# Patient Record
Sex: Male | Born: 1950
Health system: Southern US, Community
[De-identification: ages and names within clinical notes are randomized; demographics above are authoritative.]

## PROBLEM LIST (undated history)

## (undated) DIAGNOSIS — K648 Other hemorrhoids: Secondary | ICD-10-CM

## (undated) DIAGNOSIS — H269 Unspecified cataract: Secondary | ICD-10-CM

## (undated) DIAGNOSIS — K8020 Calculus of gallbladder without cholecystitis without obstruction: Secondary | ICD-10-CM

## (undated) DIAGNOSIS — J189 Pneumonia, unspecified organism: Secondary | ICD-10-CM

## (undated) DIAGNOSIS — I10 Essential (primary) hypertension: Secondary | ICD-10-CM

## (undated) DIAGNOSIS — I499 Cardiac arrhythmia, unspecified: Secondary | ICD-10-CM

## (undated) DIAGNOSIS — M1990 Unspecified osteoarthritis, unspecified site: Secondary | ICD-10-CM

## (undated) DIAGNOSIS — T7840XA Allergy, unspecified, initial encounter: Secondary | ICD-10-CM

## (undated) DIAGNOSIS — H3320 Serous retinal detachment, unspecified eye: Secondary | ICD-10-CM

## (undated) DIAGNOSIS — K5790 Diverticulosis of intestine, part unspecified, without perforation or abscess without bleeding: Secondary | ICD-10-CM

## (undated) HISTORY — DX: Calculus of gallbladder without cholecystitis without obstruction: K80.20

## (undated) HISTORY — PX: KNEE ARTHROSCOPY: SUR90

## (undated) HISTORY — DX: Unspecified osteoarthritis, unspecified site: M19.90

## (undated) HISTORY — DX: Allergy, unspecified, initial encounter: T78.40XA

## (undated) HISTORY — DX: Other hemorrhoids: K64.8

## (undated) HISTORY — PX: YAG LASER APPLICATION: SHX6189

## (undated) HISTORY — DX: Pneumonia, unspecified organism: J18.9

## (undated) HISTORY — PX: CATARACT EXTRACTION: SUR2

## (undated) HISTORY — PX: TONSILLECTOMY: SUR1361

## (undated) HISTORY — PX: KNEE CARTILAGE SURGERY: SHX688

## (undated) HISTORY — DX: Unspecified cataract: H26.9

## (undated) HISTORY — DX: Essential (primary) hypertension: I10

## (undated) HISTORY — DX: Serous retinal detachment, unspecified eye: H33.20

## (undated) HISTORY — DX: Cardiac arrhythmia, unspecified: I49.9

## (undated) HISTORY — DX: Diverticulosis of intestine, part unspecified, without perforation or abscess without bleeding: K57.90

## (undated) HISTORY — PX: RETINAL DETACHMENT SURGERY: SHX105

---

## 2005-06-10 ENCOUNTER — Inpatient Hospital Stay (HOSPITAL_COMMUNITY): Admission: RE | Admit: 2005-06-10 | Discharge: 2005-06-13 | Payer: Self-pay | Admitting: Ophthalmology

## 2009-11-02 HISTORY — PX: CHOLECYSTECTOMY: SHX55

## 2010-01-30 ENCOUNTER — Encounter (INDEPENDENT_AMBULATORY_CARE_PROVIDER_SITE_OTHER): Payer: Self-pay | Admitting: Psychology

## 2010-01-30 ENCOUNTER — Inpatient Hospital Stay (HOSPITAL_COMMUNITY): Admission: EM | Admit: 2010-01-30 | Discharge: 2010-02-03 | Payer: Self-pay | Admitting: Emergency Medicine

## 2010-01-31 ENCOUNTER — Encounter (INDEPENDENT_AMBULATORY_CARE_PROVIDER_SITE_OTHER): Payer: Self-pay | Admitting: Internal Medicine

## 2010-12-27 IMAGING — CT CT ABD-PELV W/O CM
2 of 4 series · 17 of 46 positions shown, 19 images · non-contrast
Comparison: Ultrasound 01/30/2010.

CLINICAL DATA: Abdominal pain and fever.

CT ABDOMEN AND PELVIS WITHOUT CONTRAST
TECHNIQUE: Multidetector CT imaging of the abdomen and pelvis was
performed following the standard protocol without intravenous
contrast.

[Series 4: abd/pelv w/o 5.0 b31f st · axial · non-contrast · 0.78mm/px · z∈[-503,-18]mm · 14 of 107 slices shown, 16 images]
[im 5/107  soft-tissue]
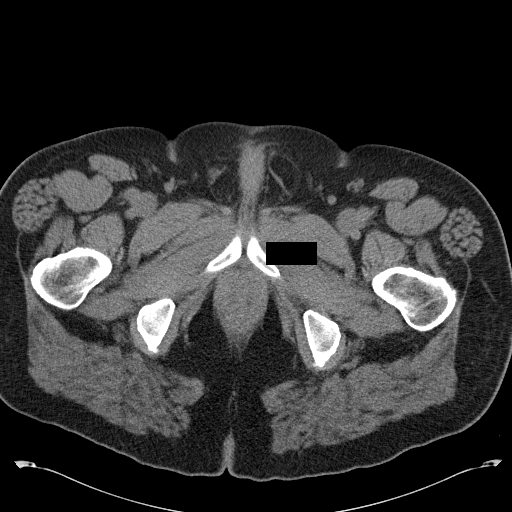
[im 5/107  bone]
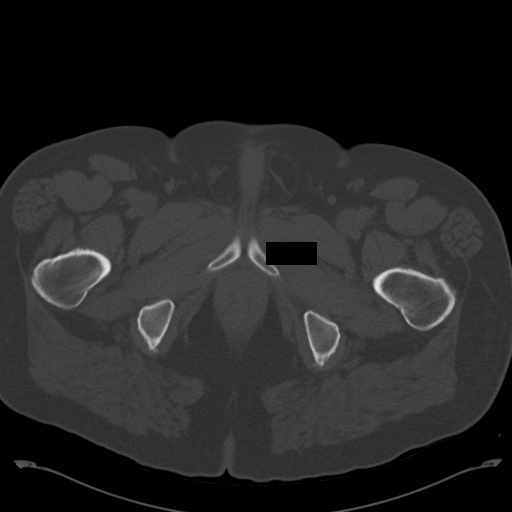
[im 13/107  soft-tissue]
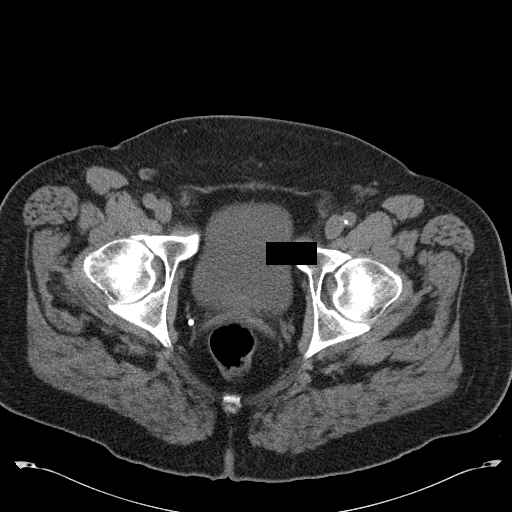
[im 22/107  soft-tissue]
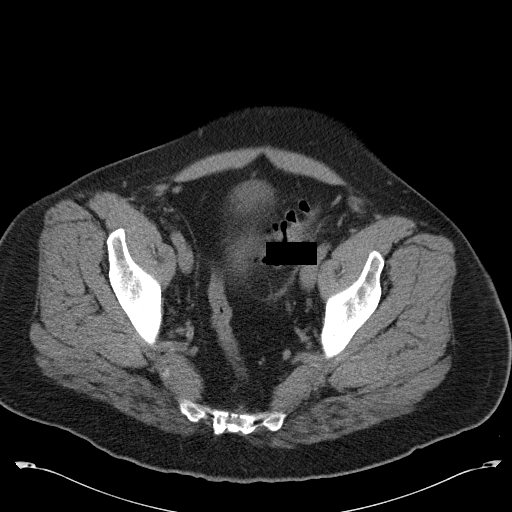
[im 30/107  soft-tissue]
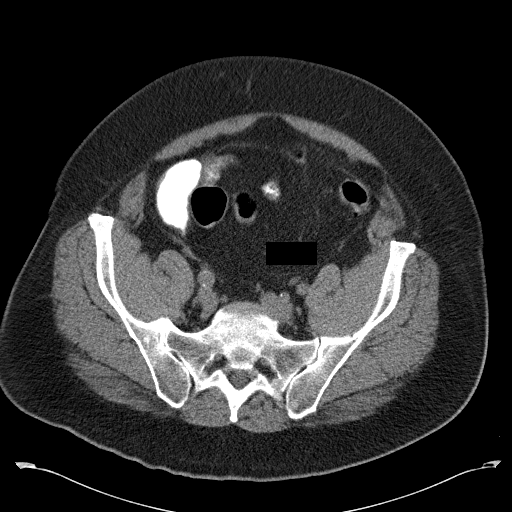
[im 34/107  soft-tissue]
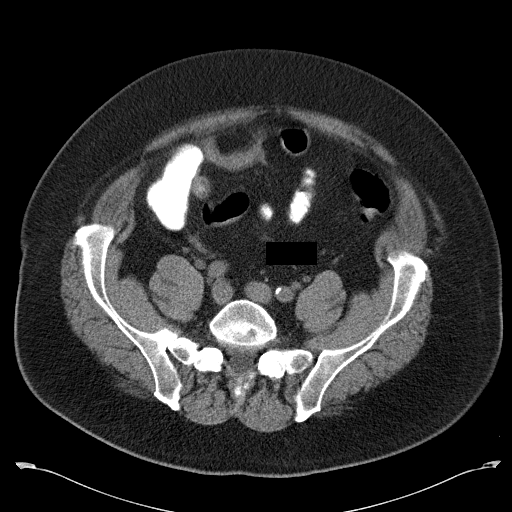
[im 43/107  soft-tissue]
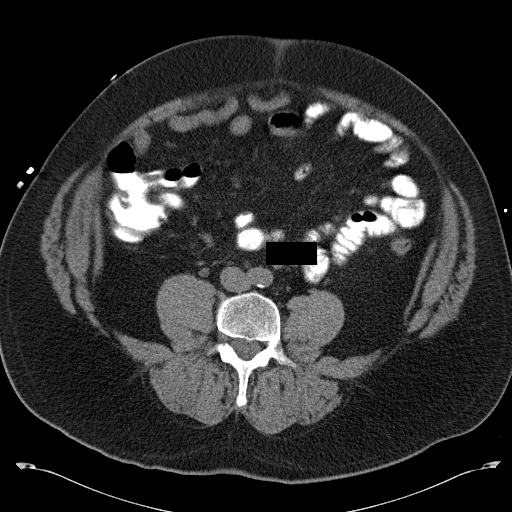
[im 51/107  soft-tissue]
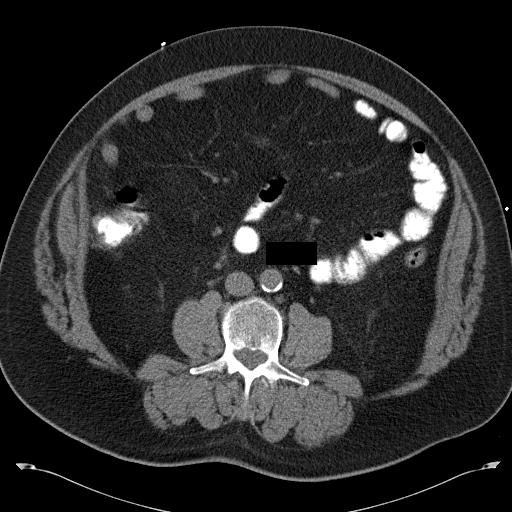
[im 56/107  soft-tissue]
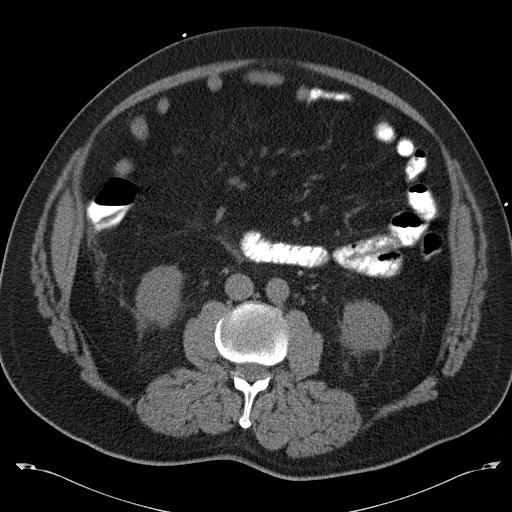
[im 64/107  soft-tissue]
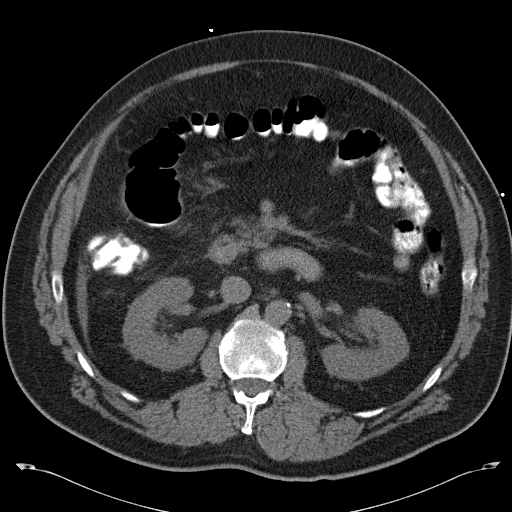
[im 64/107  bone]
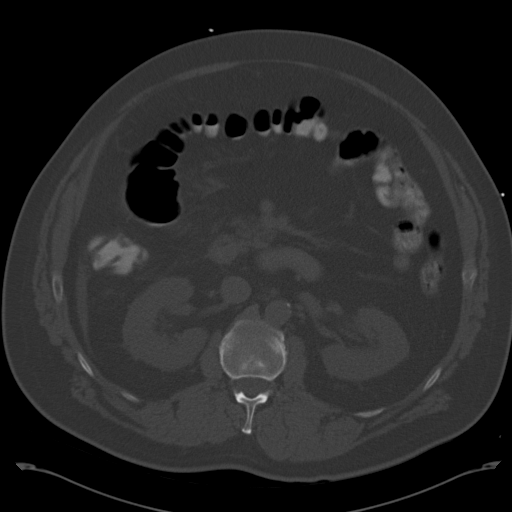
[im 73/107  soft-tissue]
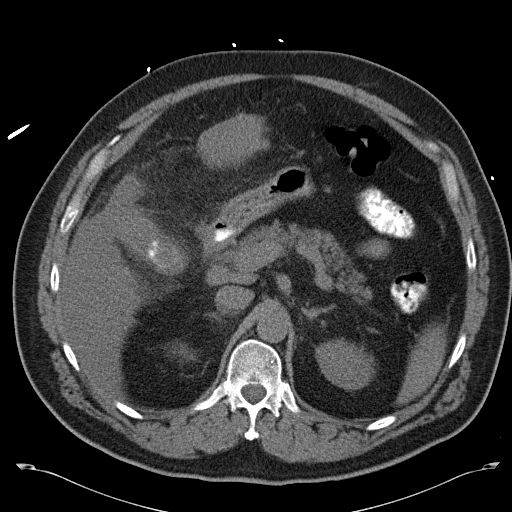
[im 81/107  soft-tissue]
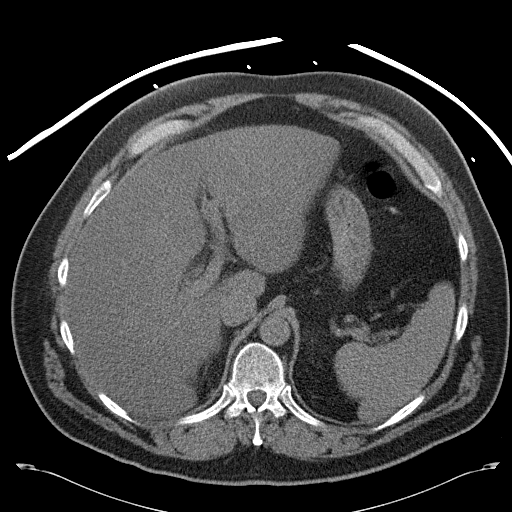
[im 85/107  soft-tissue]
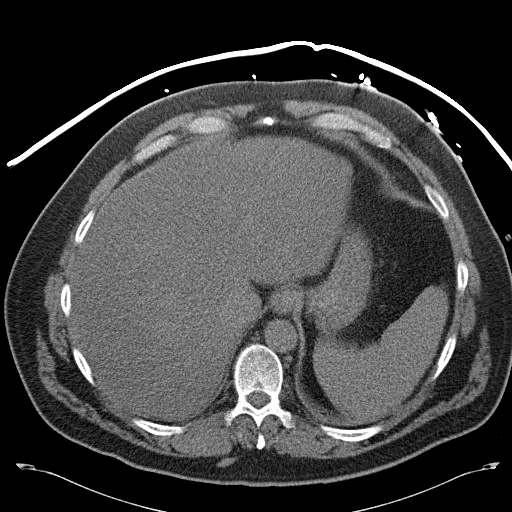
[im 94/107  soft-tissue]
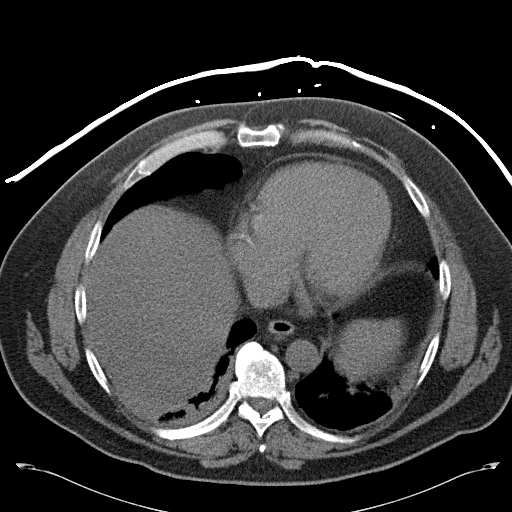
[im 102/107  soft-tissue]
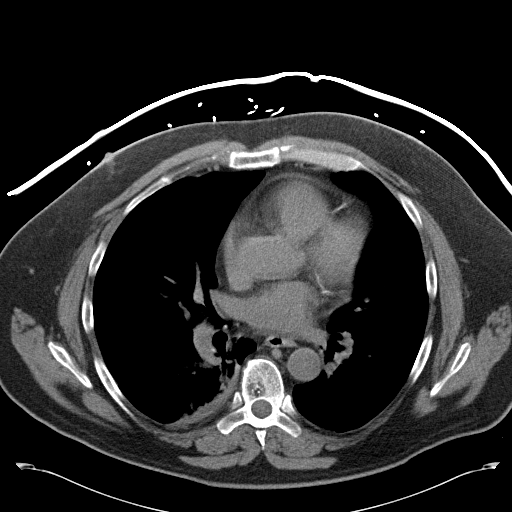

[Series 602: cor a/p · coronal · 1.04mm/px · 3 of 147 slices shown]
[im 49/147  soft-tissue]
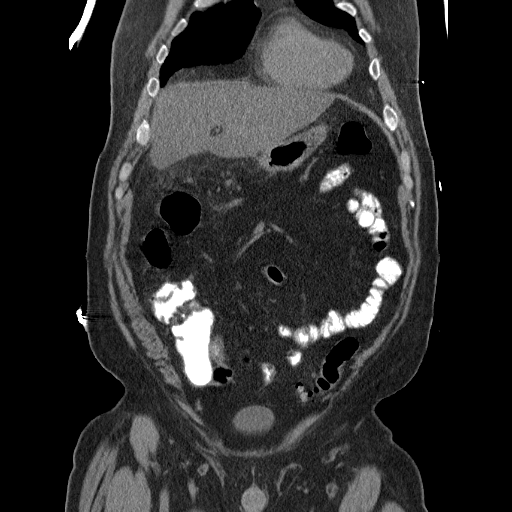
[im 65/147  soft-tissue]
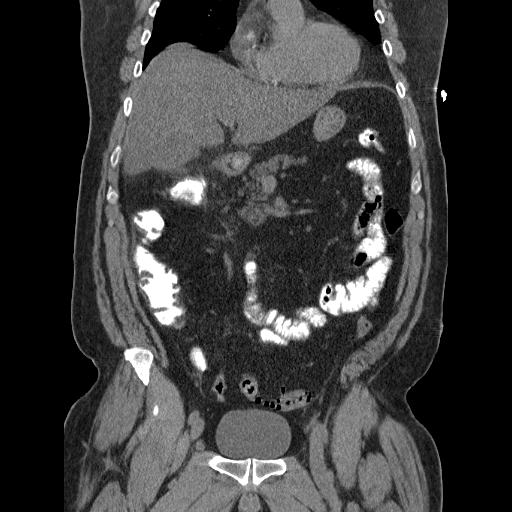
[im 82/147  soft-tissue]
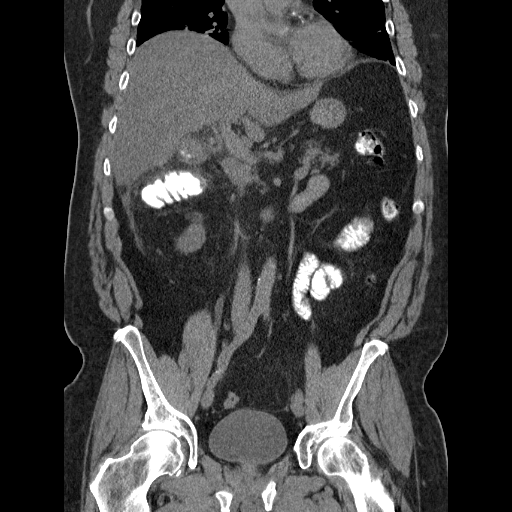

[17 of 46 positions shown; findings below may reference images not displayed]

FINDINGS: Bibasilar atelectasis.  Negative for pleural effusion.

There is a large partially calcified gallstone measuring 3.5 cm.
Gallbladder wall is mildly thickened and there is stranding in the
fat around the gallbladder suggesting acute cholecystitis.  There
is no free fluid.  There is fatty infiltration of  the liver.
There is no ductal dilatation.  Pancreas spleen kidneys are normal.

There is no bowel obstruction.  No mass or adenopathy is present.
There is sigmoid diverticulosis.
IMPRESSION: Gallstones.  There is gallbladder wall thickening and
pericholecystic edema suggesting acute cholecystitis.

Sigmoid diverticulosis.

## 2010-12-27 IMAGING — US US ABDOMEN COMPLETE
1 series · 14 of 25 positions shown · non-contrast
Comparison: None.

CLINICAL DATA: Fever.

COMPLETE ABDOMINAL ULTRASOUND

[Series 1: us abdomen complete · 0.36mm/px · 14 of 77 slices shown]
[im 1/77]
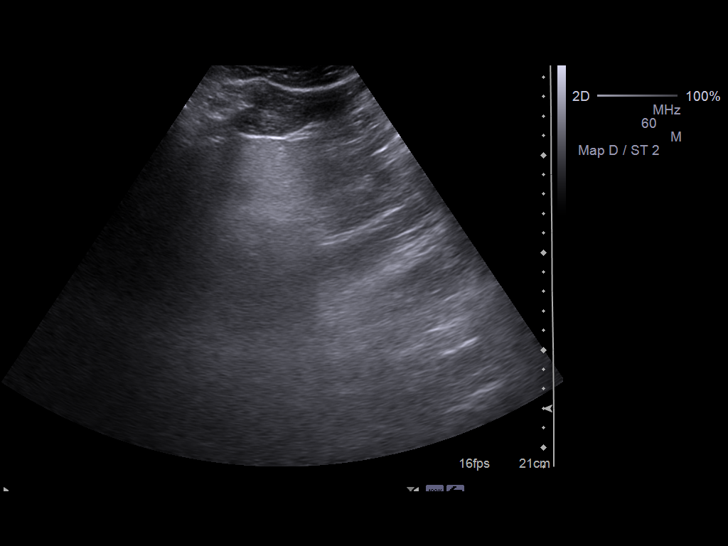
[im 7/77]
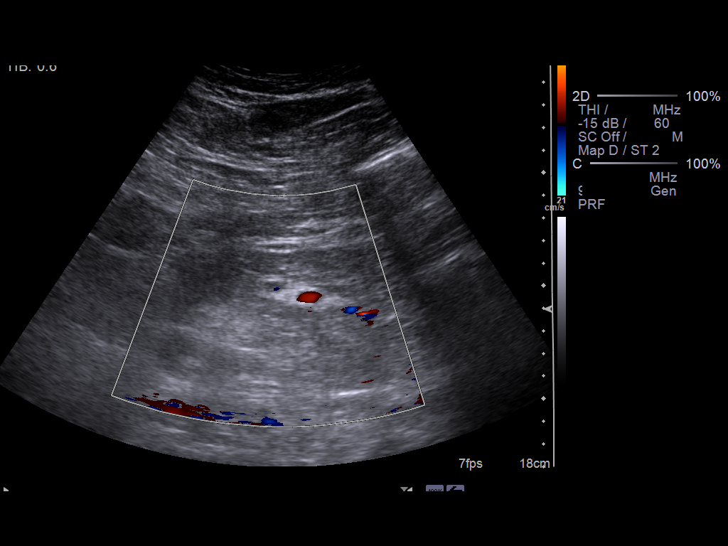
[im 13/77]
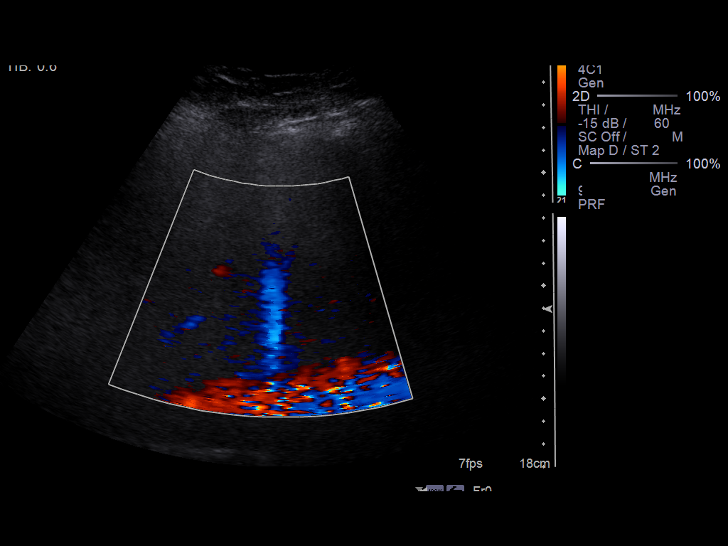
[im 20/77]
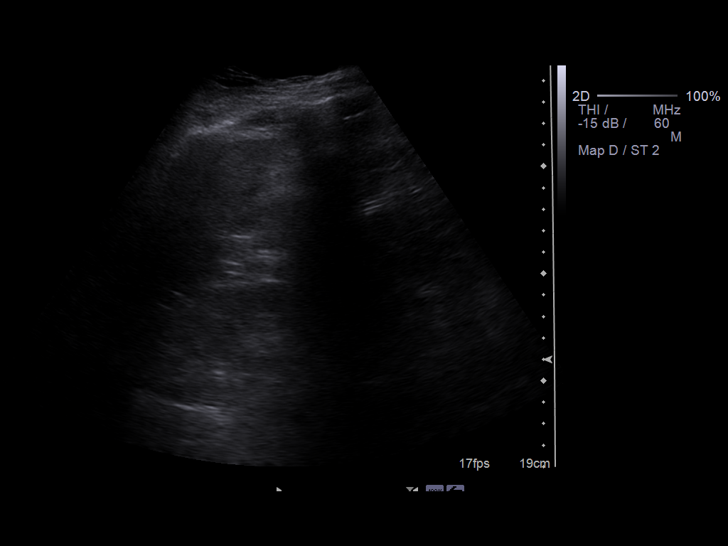
[im 26/77]
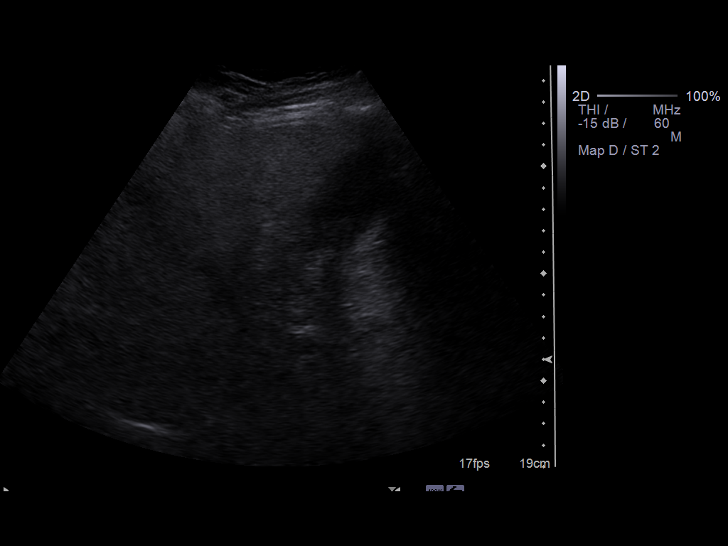
[im 29/77]
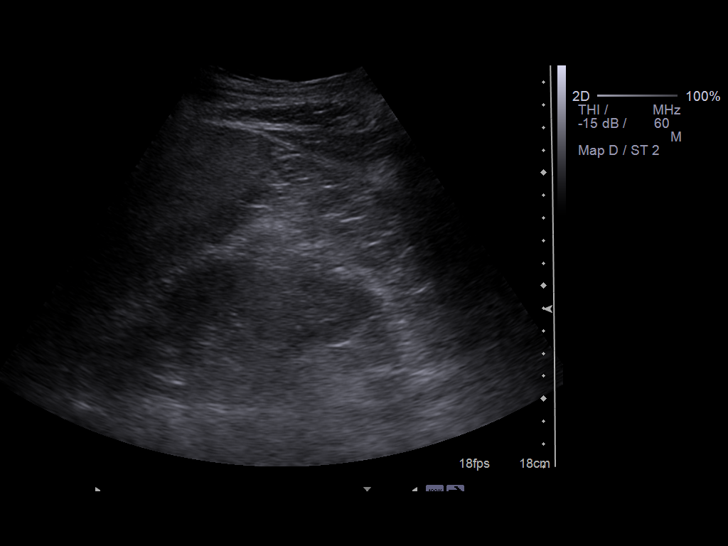
[im 35/77]
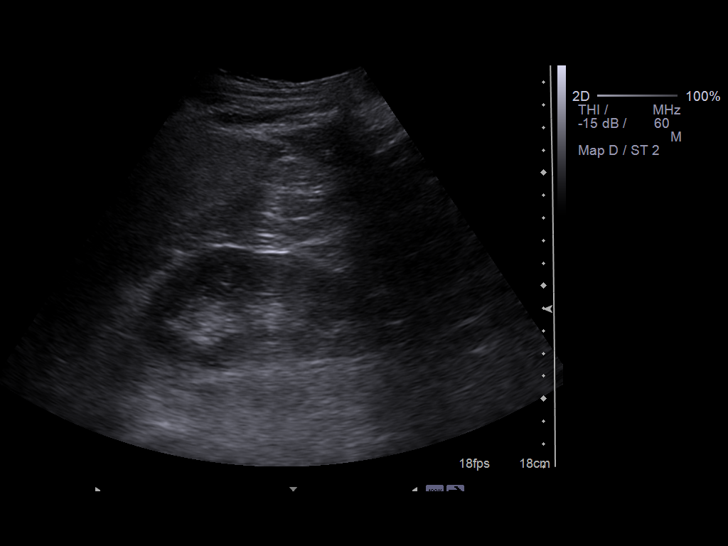
[im 42/77]
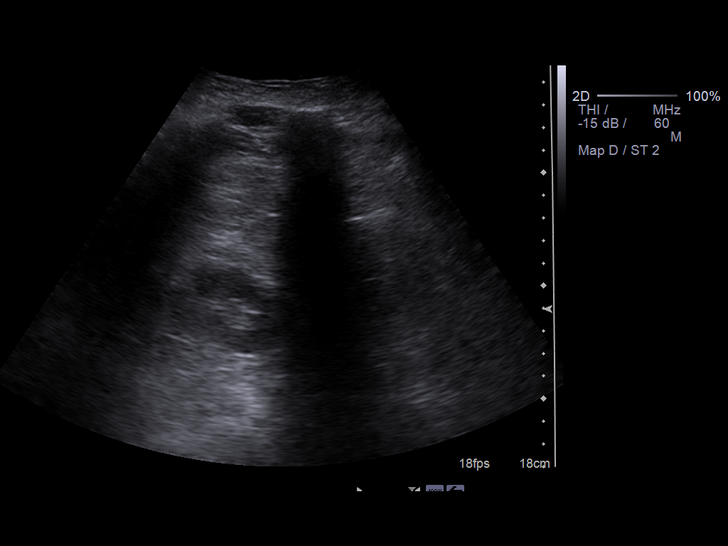
[im 48/77]
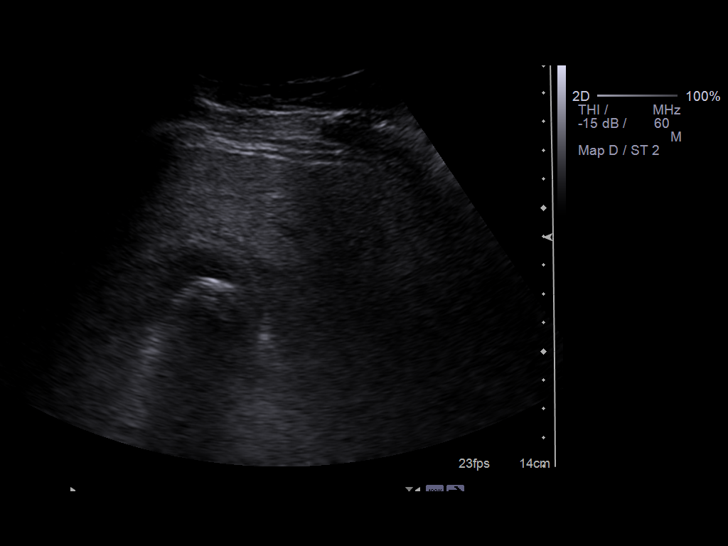
[im 51/77]
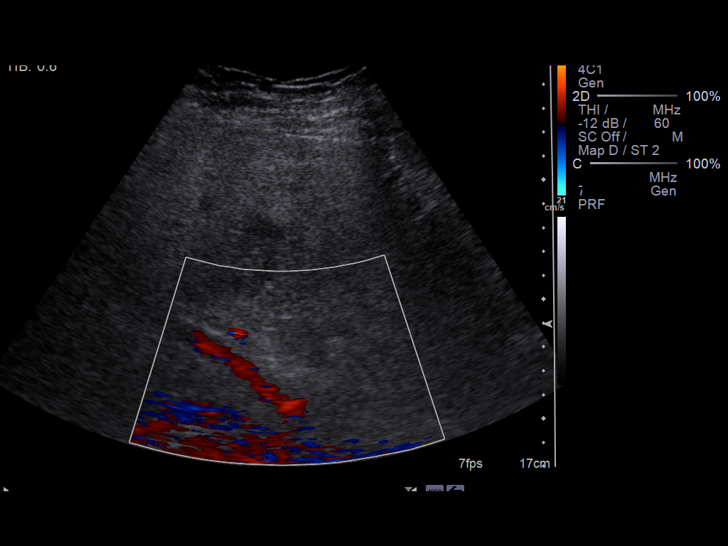
[im 58/77]
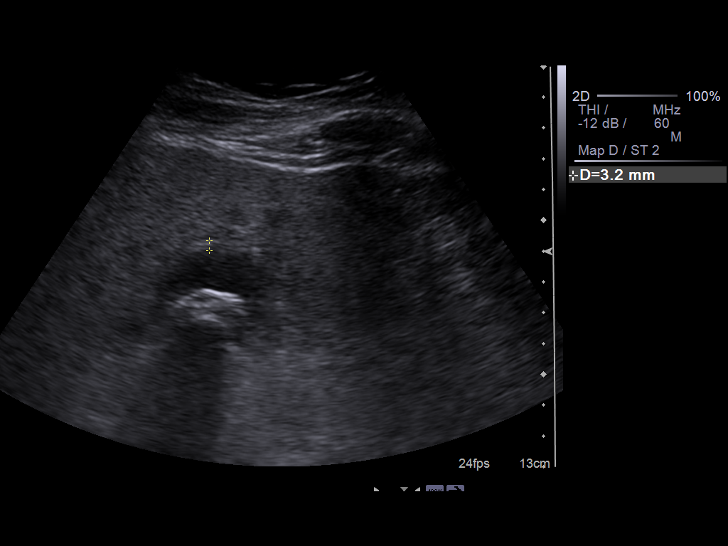
[im 64/77]
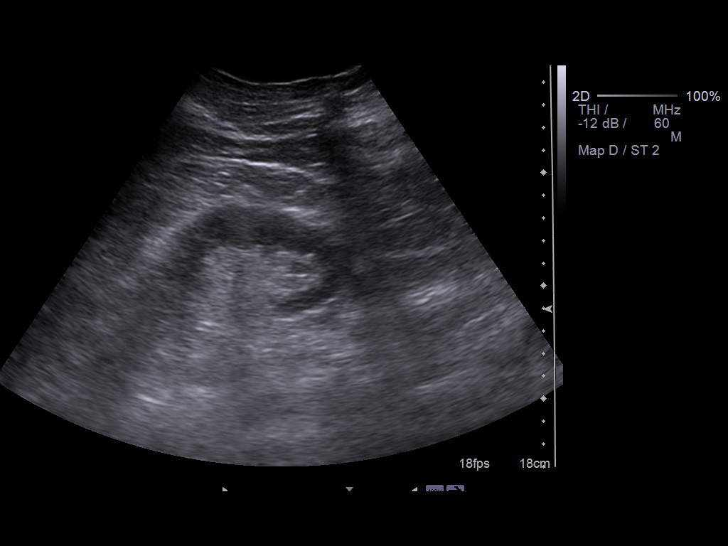
[im 70/77]
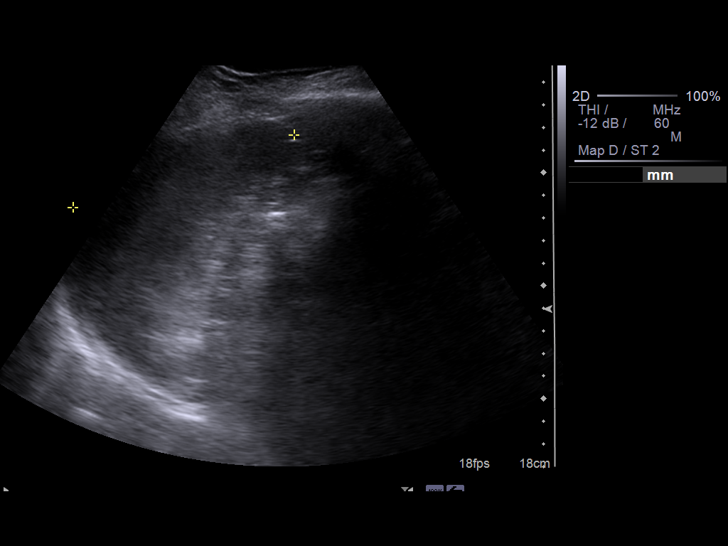
[im 77/77]
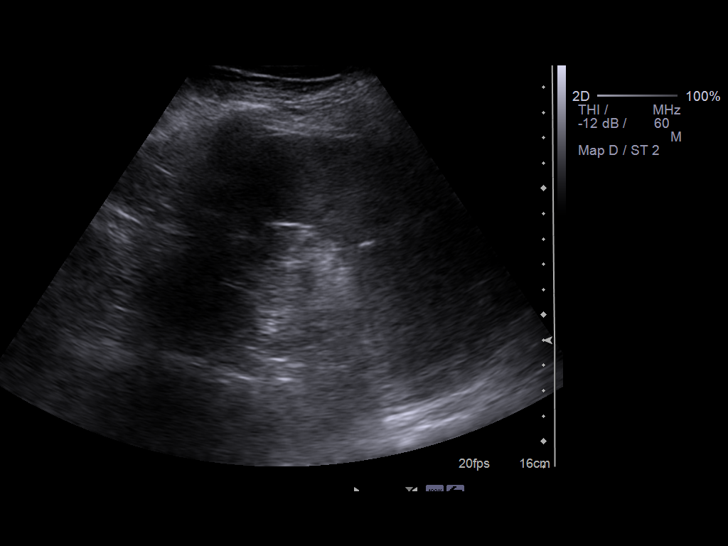

[14 of 25 positions shown; findings below may reference images not displayed]

FINDINGS: Gallbladder:    Cholelithiasis.  2 stones measure up to 2.5 cm.  No
wall thickening or pericholecystic fluid. Sonographic Murphy's sign
was not elicited.

Common bile duct: Normal, 5 mm.

Liver: Severely degraded evaluation due to technique related
factors and extent of hyperechogenicity.  The extent of hyper
echogenicity is favored to represent severe fatty infiltration.

IVC: Negative

Pancreas:  Poorly visualized due to overlying bowel gas.

Spleen:  Normal in size and echogenicity.

Right Kidney:  10.7 cm.  No hydronephrosis.

Left Kidney:  10.5 cm. No hydronephrosis.

Abdominal aorta:  No aneurysm.  Partially obscured due to
overlying bowel gas.
IMPRESSION: 1.  Cholelithiasis without cholecystitis.
2.  Probable severe fatty infiltration of the liver.  Evaluation of
the liver is degraded due to technique and the extent of
hyperechogenicity.  Alternate etiologies, including cirrhosis
cannot be excluded.
3.  Poor visualization of the pancreas and aorta due to overlying
bowel gas.

## 2010-12-28 IMAGING — RF DG CHOLANGIOGRAM OPERATIVE
1 series · 4 of 4 positions shown · non-contrast
Comparison: CT abdomen 01/30/2010

CLINICAL DATA: Laparoscopic cholecystectomy

INTRAOPERATIVE CHOLANGIOGRAM
TECHNIQUE: Cholangiographic images from the C-arm fluoroscopic
device were submitted for interpretation post-operatively.  Please
see the procedural report for the amount of contrast and the
fluoroscopy time utilized.

[Series 1: run · 4 of 26 frames shown]
[frame 3/26]
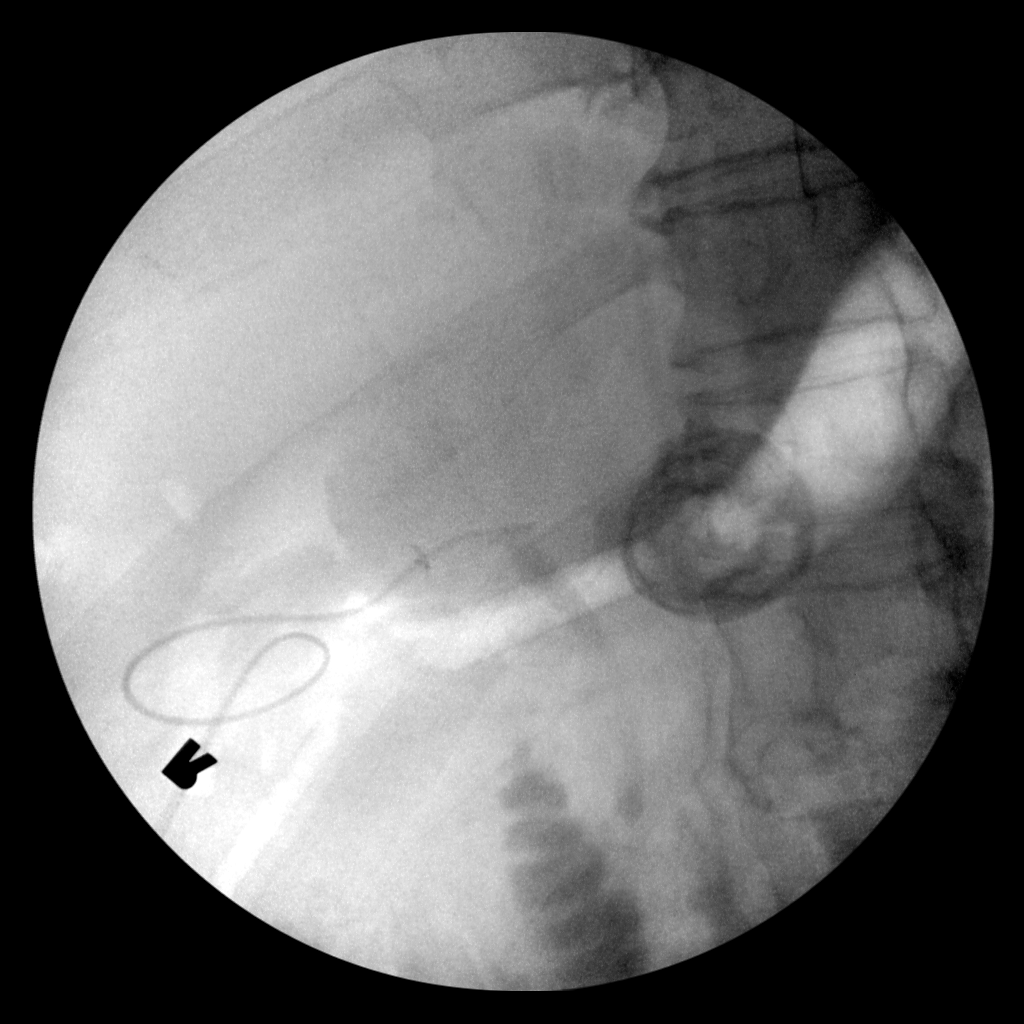
[frame 4/26]
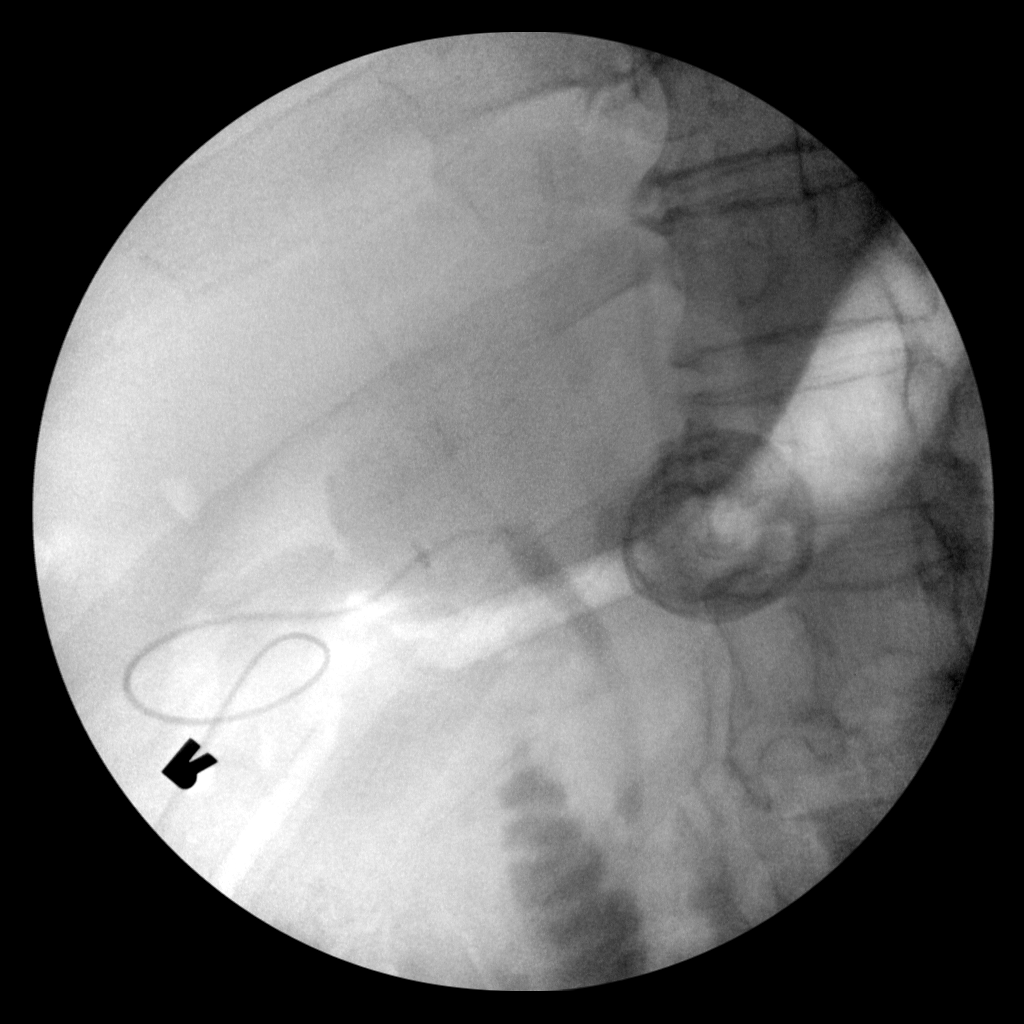
[frame 14/26]
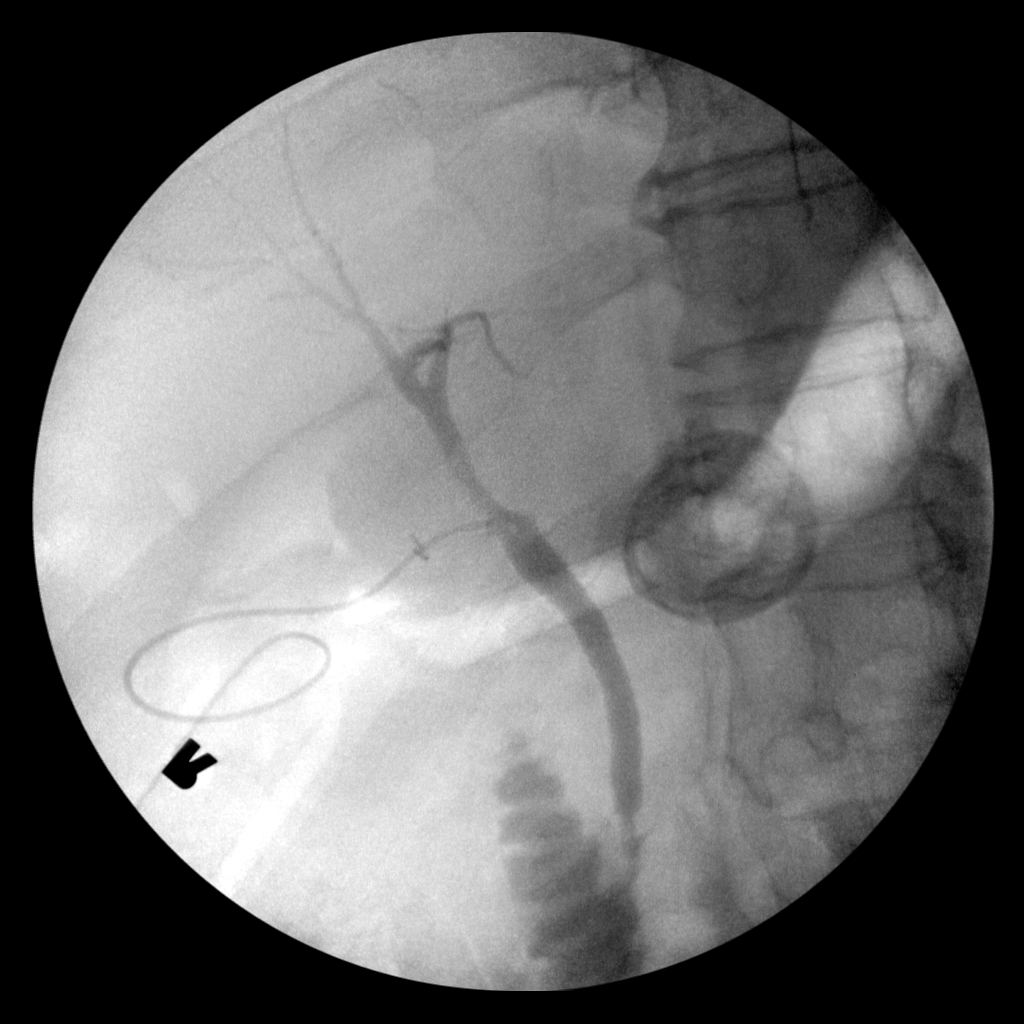
[frame 23/26]
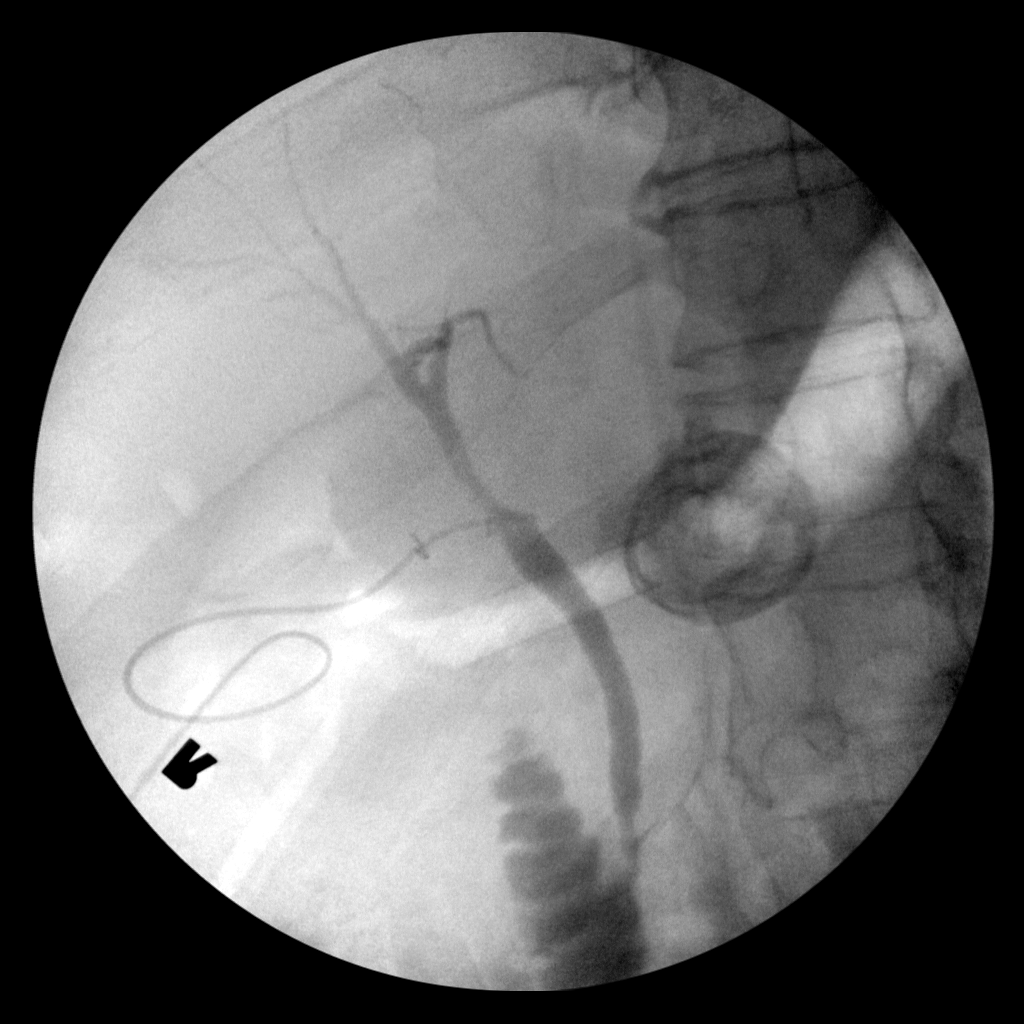

[4 of 4 positions shown; findings below may reference images not displayed]

FINDINGS: Multiple spot images of intraoperative cholangiogram are
provided.  Initial images demonstrates cannulation of the cystic
duct with injection of contrast.  Contrast fills the common hepatic
duct and common bile duct.  No evidence of retained stone.
Contrast flows into the duodenum.
IMPRESSION: No evidence of leak or retained stone within the common bile duct.

## 2010-12-30 IMAGING — CR DG CHEST 1V PORT
1 series · 1 of 1 positions shown · non-contrast
Comparison: 01/29/2010.

CLINICAL DATA: Cholelithiasis.  Cholecystitis.  pneumonia.

PORTABLE CHEST - 1 VIEW

[view not recorded]
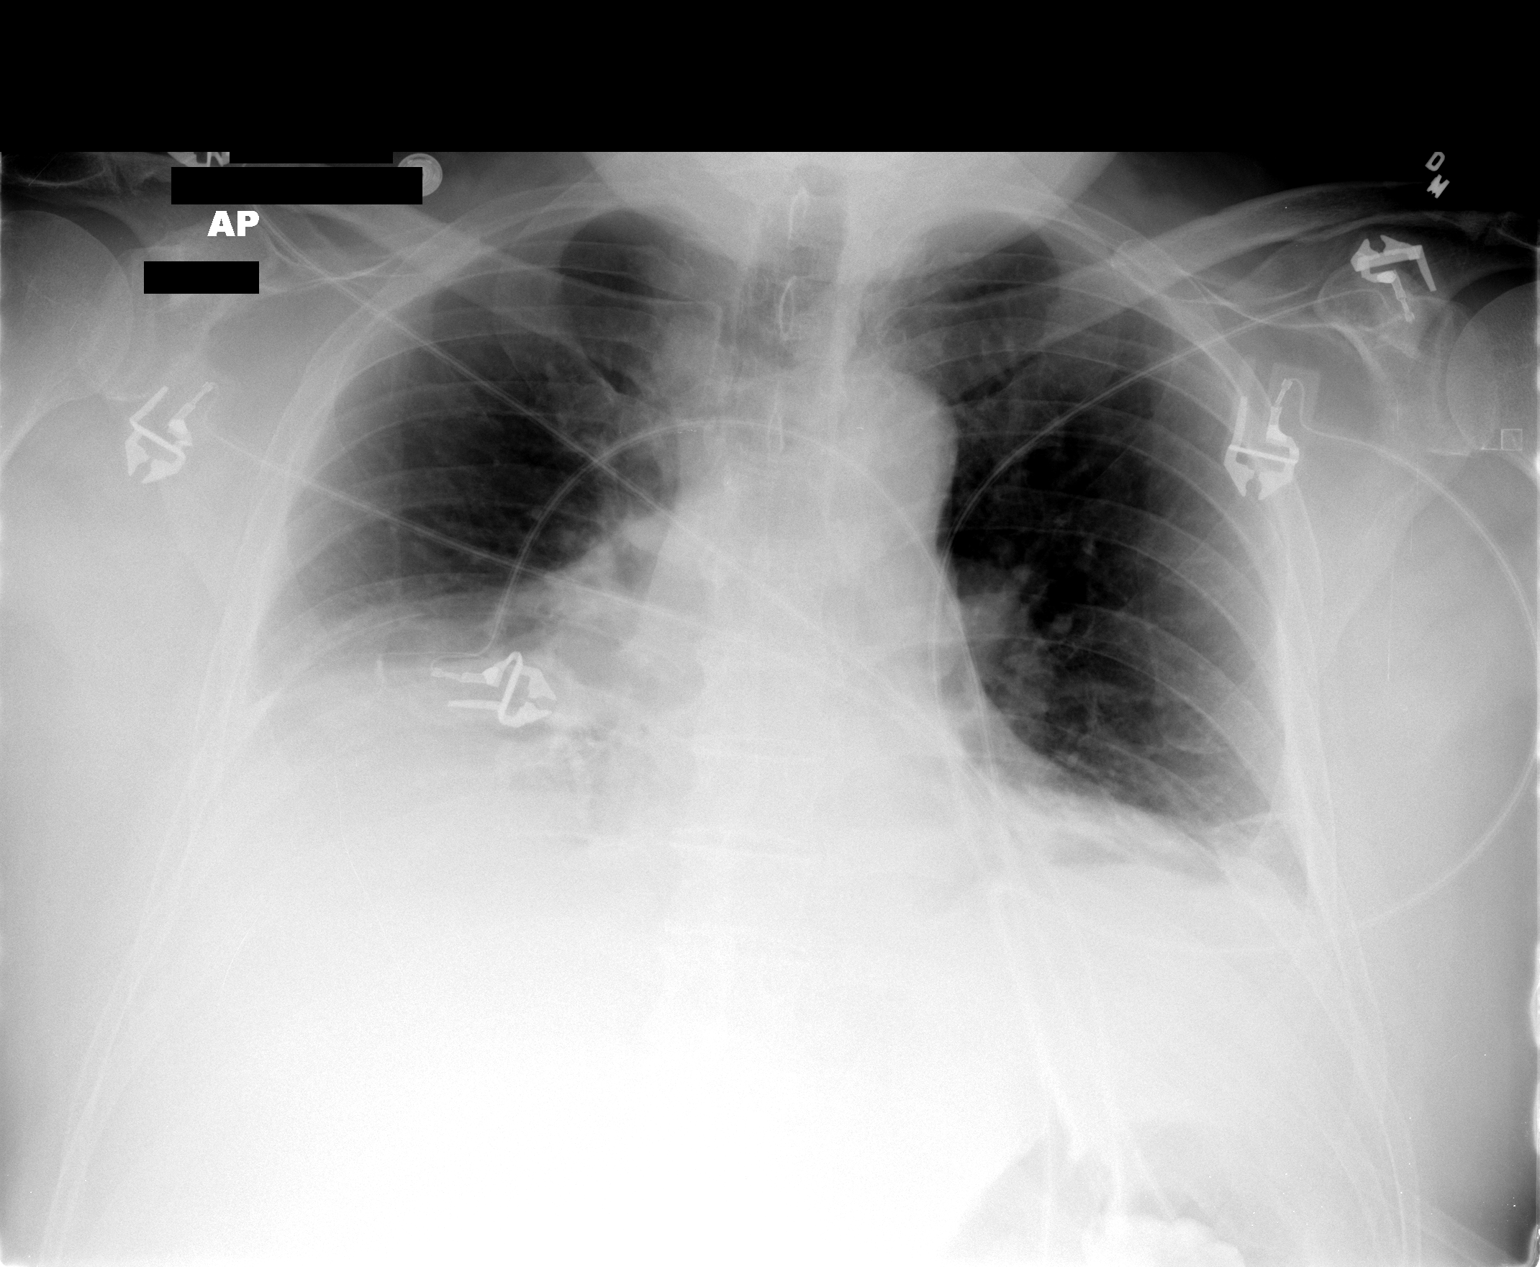

[1 of 1 positions shown; findings below may reference images not displayed]

FINDINGS: Lung volumes are markedly low.  Dense atelectasis is
present of the left hemidiaphragm and there is elevation of the
right hemidiaphragm.  Based on the low volumes and atelectasis,
underlying airspace disease is difficult to exclude.  Monitoring
leads are projected over the chest.  Accentuation of the
cardiopericardial silhouette size due to lung volumes.
IMPRESSION: Low volume chest.  Bilateral basilar atelectasis, right greater
than left.  Underlying airspace disease/pneumonia cannot be
excluded.

## 2011-01-21 LAB — COMPREHENSIVE METABOLIC PANEL
ALT: 20 U/L (ref 0–53)
AST: 20 U/L (ref 0–37)
Albumin: 2.4 g/dL — ABNORMAL LOW (ref 3.5–5.2)
Alkaline Phosphatase: 86 U/L (ref 39–117)
BUN: 18 mg/dL (ref 6–23)
Creatinine, Ser: 1.52 mg/dL — ABNORMAL HIGH (ref 0.4–1.5)
GFR calc Af Amer: 57 mL/min — ABNORMAL LOW (ref >60)
Glucose, Bld: 145 mg/dL — ABNORMAL HIGH (ref 70–99)
Potassium: 3.6 mEq/L (ref 3.5–5.1)
Total Bilirubin: 1.7 mg/dL — ABNORMAL HIGH (ref 0.3–1.2)
Total Protein: 5.7 g/dL — ABNORMAL LOW (ref 6.0–8.3)
Total Protein: 6 g/dL (ref 6.0–8.3)

## 2011-01-21 LAB — CBC
HCT: 32.2 % — ABNORMAL LOW (ref 39.0–52.0)
HCT: 33 % — ABNORMAL LOW (ref 39.0–52.0)
HCT: 34.8 % — ABNORMAL LOW (ref 39.0–52.0)
Hemoglobin: 11.2 g/dL — ABNORMAL LOW (ref 13.0–17.0)
Hemoglobin: 11.8 g/dL — ABNORMAL LOW (ref 13.0–17.0)
MCHC: 33.9 g/dL (ref 30.0–36.0)
MCV: 95.5 fL (ref 78.0–100.0)
MCV: 95.5 fL (ref 78.0–100.0)
Platelets: 168 10*3/uL (ref 150–400)
Platelets: 229 10*3/uL (ref 150–400)
Platelets: 233 10*3/uL (ref 150–400)
RBC: 3.38 MIL/uL — ABNORMAL LOW (ref 4.22–5.81)
RDW: 13.7 % (ref 11.5–15.5)
WBC: 12.1 10*3/uL — ABNORMAL HIGH (ref 4.0–10.5)
WBC: 16 10*3/uL — ABNORMAL HIGH (ref 4.0–10.5)

## 2011-01-21 LAB — BASIC METABOLIC PANEL
CO2: 26 mEq/L (ref 19–32)
Chloride: 99 mEq/L (ref 96–112)
GFR calc Af Amer: >60 mL/min (ref >60)
Potassium: 3.4 mEq/L — ABNORMAL LOW (ref 3.5–5.1)
Sodium: 133 mEq/L — ABNORMAL LOW (ref 135–145)

## 2011-01-21 LAB — CULTURE, RESPIRATORY W GRAM STAIN

## 2011-01-21 LAB — GLUCOSE, CAPILLARY
Glucose-Capillary: 114 mg/dL — ABNORMAL HIGH (ref 70–99)
Glucose-Capillary: 124 mg/dL — ABNORMAL HIGH (ref 70–99)
Glucose-Capillary: 135 mg/dL — ABNORMAL HIGH (ref 70–99)
Glucose-Capillary: 138 mg/dL — ABNORMAL HIGH (ref 70–99)
Glucose-Capillary: 145 mg/dL — ABNORMAL HIGH (ref 70–99)
Glucose-Capillary: 153 mg/dL — ABNORMAL HIGH (ref 70–99)
Glucose-Capillary: 171 mg/dL — ABNORMAL HIGH (ref 70–99)

## 2011-01-21 LAB — EXPECTORATED SPUTUM ASSESSMENT W GRAM STAIN, RFLX TO RESP C

## 2011-01-21 LAB — PROTIME-INR: Prothrombin Time: 17.2 seconds — ABNORMAL HIGH (ref 11.6–15.2)

## 2011-01-21 LAB — DIFFERENTIAL
Basophils Absolute: 0 10*3/uL (ref 0.0–0.1)
Lymphocytes Relative: 8 % — ABNORMAL LOW (ref 12–46)
Lymphs Abs: 0.8 10*3/uL (ref 0.7–4.0)
Monocytes Absolute: 0.9 10*3/uL (ref 0.1–1.0)
Monocytes Relative: 9 % (ref 3–12)

## 2011-01-21 LAB — MRSA PCR SCREENING: MRSA by PCR: NEGATIVE

## 2011-01-21 LAB — LIPID PANEL
Cholesterol: 100 mg/dL (ref 0–200)
Triglycerides: 156 mg/dL — ABNORMAL HIGH (ref <150)

## 2011-01-21 LAB — HEMOGLOBIN A1C: Hgb A1c MFr Bld: 6.5 % — ABNORMAL HIGH (ref 4.6–6.1)

## 2011-01-25 LAB — CULTURE, BLOOD (ROUTINE X 2): Culture: NO GROWTH

## 2011-01-25 LAB — CK TOTAL AND CKMB (NOT AT ARMC)
Relative Index: INVALID (ref 0.0–2.5)
Total CK: 91 U/L (ref 7–232)

## 2011-01-25 LAB — HEPATIC FUNCTION PANEL
AST: 19 U/L (ref 0–37)
Albumin: 2.9 g/dL — ABNORMAL LOW (ref 3.5–5.2)
Alkaline Phosphatase: 73 U/L (ref 39–117)
Total Bilirubin: 1.6 mg/dL — ABNORMAL HIGH (ref 0.3–1.2)

## 2011-01-25 LAB — POCT I-STAT, CHEM 8
BUN: 20 mg/dL (ref 6–23)
Chloride: 101 mEq/L (ref 96–112)
Creatinine, Ser: 1.5 mg/dL (ref 0.4–1.5)
Sodium: 131 mEq/L — ABNORMAL LOW (ref 135–145)
TCO2: 24 mmol/L (ref 0–100)

## 2011-01-25 LAB — POCT I-STAT 3, ART BLOOD GAS (G3+)
Acid-base deficit: 5 mmol/L — ABNORMAL HIGH (ref 0.0–2.0)
O2 Saturation: 93 %
Patient temperature: 37
TCO2: 21 mmol/L (ref 0–100)
pH, Arterial: 7.357 (ref 7.350–7.450)

## 2011-01-25 LAB — MRSA PCR SCREENING: MRSA by PCR: NEGATIVE

## 2011-01-25 LAB — CARDIAC PANEL(CRET KIN+CKTOT+MB+TROPI)
CK, MB: 6.7 ng/mL (ref 0.3–4.0)
Relative Index: 1.3 (ref 0.0–2.5)
Total CK: 274 U/L — ABNORMAL HIGH (ref 7–232)
Troponin I: 0.06 ng/mL (ref 0.00–0.06)

## 2011-01-25 LAB — GLUCOSE, CAPILLARY: Glucose-Capillary: 128 mg/dL — ABNORMAL HIGH (ref 70–99)

## 2011-01-25 LAB — DIFFERENTIAL
Basophils Relative: 0 % (ref 0–1)
Lymphs Abs: 0.9 10*3/uL (ref 0.7–4.0)
Monocytes Relative: 8 % (ref 3–12)
Neutro Abs: 14.9 10*3/uL — ABNORMAL HIGH (ref 1.7–7.7)
Neutrophils Relative %: 87 % — ABNORMAL HIGH (ref 43–77)

## 2011-01-25 LAB — LACTIC ACID, PLASMA: Lactic Acid, Venous: 1.4 mmol/L (ref 0.5–2.2)

## 2011-01-25 LAB — CBC
RBC: 4.19 MIL/uL — ABNORMAL LOW (ref 4.22–5.81)
WBC: 17.1 10*3/uL — ABNORMAL HIGH (ref 4.0–10.5)

## 2011-03-20 NOTE — Op Note (Signed)
NAME:  Daniel Ramirez, Daniel Ramirez NO.:  [id number]   MEDICAL RECORD NO.:  [id number]          PATIENT TYPE:  INP   LOCATION:  5737                         FACILITY:  [facility]   PHYSICIAN:  Guadelupe Sabin, M.D.DATE OF BIRTH:  08/13/1951   DATE OF PROCEDURE:  06/12/2005  DATE OF DISCHARGE:                                 OPERATIVE REPORT   PREOPERATIVE DIAGNOSIS:  Residual recurrent retinal detachment, right eye,  after scleral buckling procedure, June 10, 2005.   POSTOPERATIVE DIAGNOSIS:  Residual recurrent retinal detachment, right eye,  after scleral buckling procedure, June 10, 2005.   NAME OF OPERATION:  Pars plana vitrectomy with perfluoron fluid exchange,  perfluoron air exchange, air C3F8 exchange, endolaser photocoagulation.   SURGEON:  Guadelupe Sabin, M.D.   ASSISTANT:  Nurse.   ANESTHESIA:  General.   OPHTHALMOSCOPY:  As previously described.   OPERATIVE PROCEDURE:  After the patient was prepped and draped, a lid  speculum was inserted in the right eye. The previous 6-0 chromic sutures at  the corneal limbus were cut and removed from the superior 180 degrees. The  subconjunctival tissue was cleaned. and three sclerotomy sites prepared at  the 10, 2, and 8 o'clock position of the right eye, 3.5 mm from the limbus  using the 19-gauge MVR blade. The 4-mm vitreous infusion terminal was  secured in place at the 8:30 position. The fiberoptic light was inserted at  the 2 o'clock position and the handpiece of the vitreous infusion suction  cutter at the 10 o'clock position. The tip of the infusion terminal could be  seen projecting into the vitreous cavity. The infusion terminal was secured  in place with a 5-0 green Mersilene suture. The fiberoptic light pipe was  inserted at the 2 o'clock position and the handpiece of the vitreous  infusion suction cutter at the 10 o'clock position. Slow vitreous infusion  suction cutting were begun. The vitreous was quite  fibrinous. Attention was  paid to the scleral buckle, and the retinal tear appeared to be in  satisfactory position toward the edge of the buckle and appeared to be  slightly elevated. The horseshoe tear flap was then excised with the  vitreous infusion suction cutter to release vitreous traction. After the  vitrectomy had been performed, it was elected to instill perfluoron into the  vitreous cavity as the fluid exited the sclerotomy site.  Approximately 4 cc  was injected, filling the vitreous cavity up to the edge of the implant.  Indirect ophthalmoscopy was then performed revealing that the retina had  flattened considerably with the posterior pole and macula completely flat.  The tear appeared to be in satisfactory position on the buckle, and only a  small meridional fold was noted at the 10 o'clock position with minimal  subretinal fluid posterior to it.  The endolaser photocoagulator was  assembled and approximately 60 applications made on the surface of the  buckle around the retinal tear. The previous diathermy from the previous  operation could be seen under the retina. Good laser photocoagulation burns  were achieved. It  was then elected to exchange the perfluoron for air  exchange. This was completed as the perfluoron was aspirated with the  handpiece of the vitreous infusion suction cutter as air was instilled  through the infusion terminal.  Re-inspection of the eye revealed no change  with flattening of the retina and a good buckling indentation and a smooth  retinal tear. It was then elected to exchange the air for 10% C3F8 gas. This  was injected through the infusion terminal. The sclerotomy sites were closed  with 7-0 Vicryl sutures. The conjunctiva and Tenon's capsule were pulled  forward and closed with a running 7-0 Vicryl suture. Schiotz tonometry was  recorded at 15-20 scale units indicating a low pressure, although on finger  palpation. the pressure seemed to be  moderate. Depo,  Garamycin, and  dexamethasone were injected in the sub-Tenon's space inferiorly. Maxitrol  and atropine ointment were instilled in the conjunctival cul-de-sac. A light  patch and protector shield were applied to the operated right eye. Duration  of procedure:  1-1/2 hours. The patient tolerated the procedure well in  general and left the operating room for the recovery room and subsequently  to his ward location 5727.       HNJ/MEDQ  D:  06/12/2005  T:  06/13/2005  Job:  [id number]   cc:   Vernell Leep., M.D.  [address] [place]  [place] [address]  Fax: [phone]

## 2011-03-20 NOTE — Discharge Summary (Signed)
NAME:  Daniel Ramirez, SOTOMAYOR NO.:  [id number]   MEDICAL RECORD NO.:  [id number]          PATIENT TYPE:  INP   LOCATION:  5737                         FACILITY:  [facility]   PHYSICIAN:  Guadelupe Sabin, M.D.DATE OF BIRTH:  06/12/1951   DATE OF ADMISSION:  06/10/2005  DATE OF DISCHARGE:  06/13/2005                                 DISCHARGE SUMMARY   This was a urgent outpatient admission of this 60 year old white male  admitted with a rhegmatogenous retinal detachment of his eye right.   HOSPITAL COURSE:  The patient had been evaluated preoperatively and was felt  to have had medical clearance for the proposed surgery on both eyes. A  scleral buckling procedure right eye and transconjunctival cryopexy of the  left eye for retinal tears and peripheral retinal degeneration. The patient,  therefore, was taken to the operating room on June 10, 2005 where a complex  scleral buckling procedure was performed on the right eye using solid  silicone implants #277 and #240 with cryoapplication, diathermy application,  external drainage of subretinal fluid and intravitreal air injection. The  patient tolerated the procedure well. There, however, was some residual  subretinal fluid and slight elevation of the retinal tear at the 10:30  position. The patient was positioned in bed on his left side to allow the  intraocular air bubble to tamponade the retinal break. On inspection the  following day revealed that the tear was still elevated, although over the  implant surface and that there were still residual subretinal fluid. The  patient was again monitored and felt to warrant admission to the hospital  for further observation and postoperative positioning. When seen, however,  on June 12, 2005, it was felt that the patient was not improving and that  further surgery was indicated. The patient therefore was returned to the  operating room where a posterior vitrectomy was performed  through the pars  plana using the vitreous infusion suction cutter with removal of the  vitreous degenerative membranes and a __________ vitreous fluid exchange  performed followed by a ___________ air exchange followed by air 10% C3F8  exchange. At the end of surgery, the vitreous cavity was filled with CEF8.  It was then elected to perform additional endolaser photocoagulation around  the area of the retinal tear at the 10:30 position. Good burns were  achieved. Following the hour to hour and half procedure, the patient was  taken to the recovery room and returned to the 23-hour observation unit to  his bed there. The patient was positioned on his left side with and/or face  down. The patient was examined with the slit lamp and applanation tonometry  was within normal limits on the evening of surgery and the following  morning. The retinal tear appeared to be flat on the implant surface and the  vitreous cavity filled with the C3F8 gas. It was felt that the patient had  achieved maximal hospital benefit by the following morning and that he could  be discharged home to be followed in the office. The patient and his wife  were given a printed list of discharge instructions on the care and use of  the operated eye discharge.  The patient's discharge medications included  TobraDex and Cyclomydril ophthalmic solutions 1 drop four times a day.  Maxitrol and atropine ointment at bedtime.   The patient was therefore discharged home with a printed list of  instructions on how to care for his eye and this was discussed with his  wife. The patient is to return to the office on Wednesday, June 24, 2005  for follow-up evaluation.   CONDITION ON DISCHARGE:  Improved.   DISCHARGE DIAGNOSES:  1.  Rhegmatogenous retinal detachment right eye and multiple tears.  2.  Peripheral retinal tears of left eye.   DISCHARGE MEDICATIONS:  For the left eye was limited to TobraDex ophthalmic  solution and  Cyclomydril ophthalmic solution 1 drop four times a day.  Examination of the left eye revealed flattening of the retina and good cryo  reaction around the multiple retinal tears.       HNJ/MEDQ  D:  06/13/2005  T:  06/13/2005  Job:  [id number]

## 2011-03-20 NOTE — H&P (Signed)
NAME:  Daniel Ramirez, Daniel Ramirez NO.:  [id number]   MEDICAL RECORD NO.:  [id number]          PATIENT TYPE:  OIB   LOCATION:  5727                         FACILITY:  [facility]   PHYSICIAN:  Guadelupe Sabin, M.D.DATE OF BIRTH:  15-May-1951   DATE OF ADMISSION:  06/10/2005  DATE OF DISCHARGE:                                HISTORY & PHYSICAL   This was an urgent outpatient admission of this 60 year old white male  admitted with a rhegmatogenous retinal detachment of the right eye and  retinal tears of the left eye.   HISTORY PRESENT ILLNESS:  This patient was in good visual health until four  days prior to admission when he began to note the onset of a blurred vision  and visual inferior visual field defect in the right eye. This progressed  and the patient was seen by Dr. Donalda Ewings, his regular ophthalmologist,  who made the diagnosis of a retinal detachment. The patient was referred to  my office where this diagnosis was confirmed in the right eye and in the  left eye retinal tears were noted along with a probable old retinal  detachment which was self-sealed. The patient was felt to need urgent  surgery. He was given oral discussion and printed information concerning  scleral buckling and vitrectomy surgical procedures right eye and cryopexy  left eye. The patient elected to proceed with surgery at this time under  general anesthesia.   PAST MEDICAL HISTORY:  The patient is under the care of Dr. Cleta Alberts. The  patient is said to be in stable general health with controlled hypertension.  Current medications include Maxzide and Clarinex and multivitamins. The  patient does not take aspirin regularly.   FAMILY HISTORY:  Negative for vision problems.   ALLERGIES:  No known allergies except seasonal allergies.   REVIEW OF SYSTEMS:  No cardiorespiratory complaints.   PHYSICAL EXAMINATION:  VITAL SIGNS:  As recorded on admission, blood  pressure 138/93, temperature 97.4, pulse  88, respirations 16.  GENERAL APPEARANCE:  The patient is a pleasant well-nourished, well-  developed white male in acute ocular distress.  HEENT:  Eyes:  Visual acuity without correction finger counting right eye in  upper quadrant only, 20/400 left eye; with correction finger counting right  eye in upper quadrant 20/20 left eye. Current glasses are myopic -6.50  sphere +1.75 cylinder axis 103; left eye -6.25 sphere +1.75 cylinder axis  55; bifocal addition +150. External ocular and slit lamp examination:  The  eyes are white and clear with a clear cornea, deep and clear anterior  chamber and clear lens. Dilated fundus examination from Dr. Harlin Heys' office  reveals right eye with indirect ophthalmoscopy. A bullous rhegmatogenous  retinal detachment superiorly. This drifts downward and covers the macular  area. At the 11 o'clock position is a horseshoe retinal tear and at the 12  o'clock position is three small round holes possibly retinoschisis holes.  Inferiorly at the 4:30 position is a small area of retinal elevation  possibly retinoschisis with two or three tiny round holes. The macular area  is  detached. Left eye: The vitreous is clear. The retina is attached.  Superiorly are noted three small areas of peripheral retinal degeneration  and a possible round retinal hole at the 1 o'clock position. Inferiorly,  there appears to be a crescent-shaped area from the 6:30 to 4 o'clock  position extending up almost to the equator which shows chorioretinal  pigmentation and a possible linear old retinal tear adjacent to the ora  serrata. At the 3:30 position is a small retinal tear surrounded by circular  chorioretinal pigmentation. The macular area is attached in the left eye.  CHEST/LUNGS:  Clear to percussion and auscultation.  HEART:  Normal sinus rhythm, no cardiomegaly. No murmurs.  ABDOMEN:  Negative.  EXTREMITIES:  Negative.   ADMISSION DIAGNOSES:  1.  Rhegmatogenous retinal  detachment right eye.  2.  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  3.  Multiple round retinal holes and possible old retinal detachment      inferiorly.   SURGICAL PLAN:  Scleral buckling procedure right eye with possible  vitrectomy, cryopexy left eye. The patient has been given oral discussion  and printed information concerning the procedure and its possible  complications. He has signed an informed consent and arrangements made for  his outpatient admission at this time.       HNJ/MEDQ  D:  06/10/2005  T:  06/11/2005  Job:  [id number]   cc:   Vernell Leep., M.D.  [address] [place]  [place] [address]  Fax: [phone]   Stan Head. Cleta Alberts, M.D.  [address]  [place]  [place] [address]  Fax: [phone]

## 2011-03-20 NOTE — Op Note (Signed)
NAME:  Daniel Ramirez, Daniel Ramirez NO.:  [id number]   MEDICAL RECORD NO.:  [id number]          PATIENT TYPE:  OIB   LOCATION:  5727                         FACILITY:  [facility]   PHYSICIAN:  Guadelupe Sabin, M.D.DATE OF BIRTH:  [birth year]   DATE OF PROCEDURE:  06/10/2005  DATE OF DISCHARGE:                                 OPERATIVE REPORT   PREOPERATIVE DIAGNOSIS:  Peripheral retinal degeneration with the retinal  holes, left eye.   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   OPERATION:  Transconjunctival cryopexy, left eye.   SURGEON:  Guadelupe Sabin, M.D.   ASSISTANT:  Nurse.   ANESTHESIA:  General.   OPHTHALMOSCOPY:  As previously described.   OPERATIVE PROCEDURE:  After finishing the scleral buckling procedure on the  right eye, attention was then paid to the left eye. An incision was made  through the drapes exposing the left eye. A lid speculum was inserted. Using  indirect ophthalmoscopy and the retinal cryo probe, three areas of possible  retinal tear or peripheral retinal degeneration were treated in the upper  temporal quadrant. Good cryo reactions could be seen. It was then elected to  close. The instruments were withdrawn and Maxitrol and atropine ointment  instilled in the conjunctival cul-de-sac. No patch was applied to the  operated left eye. Duration of procedure 20 minutes. The patient tolerated  the procedure well in general and left the operating room for the recovery  room and subsequently to the 23-hour observation unit.       HNJ/MEDQ  D:  06/10/2005  T:  06/11/2005  Job:  [id number]

## 2011-03-20 NOTE — Op Note (Signed)
NAME:  Daniel Ramirez, Daniel Ramirez NO.:  [id number]   MEDICAL RECORD NO.:  [id number]          PATIENT TYPE:  OIB   LOCATION:  5727                         FACILITY:  [facility]   PHYSICIAN:  Guadelupe Sabin, M.D.DATE OF BIRTH:  1951-04-18   DATE OF PROCEDURE:  06/10/2005  DATE OF DISCHARGE:                                 OPERATIVE REPORT   PREOPERATIVE DIAGNOSIS:  Rhegmatogenous retinal detachment, right eye.   POSTOPERATIVE DIAGNOSIS:  Rhegmatogenous retinal detachment, right eye.   OPERATION:  Scleral buckling procedure, right eye, using solid silicone  implants #277 and #240 with cryo application, diathermy application,  external drainage of subretinal fluid, and intravitreal air injection.   SURGEON:  Guadelupe Sabin, M.D.   ASSISTANT:  Nurse.   ANESTHESIA:  General.   OPHTHALMOSCOPY:  As previously described.   OPERATIVE PROCEDURE:  After the patient was prepped and draped, lid traction  sutures were placed in the right upper and lower lids. A lid speculum was  inserted. A peritomy was performed adjacent to the limbus 360 degrees. The  subconjunctival tissue was cleaned and the four rectus muscles isolated with  4-0 silk traction sutures. The sclera was inspected and felt to be somewhat  thin in the upper temporal quadrant but of sufficient thickness for lamellar  scleral dissection. Localization was then carried out with the retinal cryo  probe and the horseshoe retinal tear localized at approximately the equator  at the 10:30 position. The tear site was treated with direct cryo  application. Due to the marked elevation of the bullous retinal detachment,  it was difficult to obtain an exact localization. It was then elected to  perform lamellar scleral dissection from the 1:00 to 9:30 position the bed  measuring 7 mm in width. The scleral flaps were extremely thin and the inner  lamella also thin. Light diathermy applications were applied to the inner  scleral  lamella. A total of four 4-0 green Mersilene sutures were used to  close the flaps loosely over a trimmed #277 solid silicone implant. A #240  solid silicone encircling band was placed about the globe and tied with two  sutures at the 6:30 and 7 o'clock position. Anchoring sutures of 5-0 white  Dacron were placed at the 8:00 and 4:30 position to hold the encircling band  in place at the equator. After repeat indirect ophthalmoscopy, it was  elected to drain fluid in the bed at the 10 o'clock incision at the 10  o'clock meridian. An incision was made through the inner scleral lamella and  subretinal fluid drained spontaneously without perforation with the pen  electrode, and a moderate amount of clear subretinal fluid drained which was  slightly viscid. The scleral flaps were pulled up and the fundus inspected  revealing residual subretinal fluid. The drain site was then reopened and  additional fluid drained. Inspection still revealed some subretinal fluid.  The patient had been given Diamox 500 milligrams intravenously at the  beginning of the procedure. The encircling band was adjusted. It was elected  to attempt a second drain site at  the 11:30 position. Incision was made  through the inner scleral lamella but the cord was not completely seen.  Using the pen electrode, the inner scleral lamella was perforated but no  subretinal fluid drained. It was then elected to reinspect the fundus with  indirect ophthalmoscopy. There still remained  subretinal fluid. It was felt  that the tear, however, was in probable satisfactory position over the  implant surface. As the eye was becoming hypotenuse, it was elected to  inject filtered air into the vitreous cavity via a pars plana injection site  at the 8 o'clock position 3.5 mm from the limbus with a 30-gauge needle.  Approximately 0.4 mL was injected to reestablish the interocular pressure.  Inspection of the fundus revealed good perfusion of the  optic nerve blood  vessels and what was felt to be an adequate intravitreal air bubble for  tamponading the retinal break at the 12 o'clock position. It was, therefore,  elected to close hoping that the air bubble would tamponade the hole while  the subretinal fluid absorbed. The scleral flaps were tightened and Tenon's  capsule pulled forward in the four quadrants and tied as a separate layer  with a 6-0 chromic catgut suture. Neosporin ophthalmic solution was  irrigated in the subtenon space. The conjunctiva was then pulled forward and  closed with a running 6-0 chromic catgut suture. Depo Garamycin and  dexamethasone were injected in the subtenon space inferiorly. Maxitrol and  atropine ointment were instilled in the conjunctival cul-de-sac. Duration of  procedure was 1 hour, 45 minutes. The patient tolerated the procedure well  in general, and left the operating room for the recovery room in good  condition, and subsequently to the 23-hour observation unit.       HNJ/MEDQ  D:  06/10/2005  T:  06/11/2005  Job:  [id number]

## 2012-03-22 ENCOUNTER — Encounter: Payer: Self-pay | Admitting: Emergency Medicine

## 2012-04-12 ENCOUNTER — Ambulatory Visit (INDEPENDENT_AMBULATORY_CARE_PROVIDER_SITE_OTHER): Payer: BC Managed Care – PPO | Admitting: Emergency Medicine

## 2012-04-12 ENCOUNTER — Encounter: Payer: Self-pay | Admitting: Emergency Medicine

## 2012-04-12 VITALS — BP 122/78 | HR 87 | Temp 98.0°F | Resp 16 | Ht 71.0 in | Wt 257.4 lb

## 2012-04-12 DIAGNOSIS — R030 Elevated blood-pressure reading, without diagnosis of hypertension: Secondary | ICD-10-CM

## 2012-04-12 DIAGNOSIS — E785 Hyperlipidemia, unspecified: Secondary | ICD-10-CM | POA: Insufficient documentation

## 2012-04-12 DIAGNOSIS — E789 Disorder of lipoprotein metabolism, unspecified: Secondary | ICD-10-CM

## 2012-04-12 DIAGNOSIS — R739 Hyperglycemia, unspecified: Secondary | ICD-10-CM

## 2012-04-12 DIAGNOSIS — I4510 Unspecified right bundle-branch block: Secondary | ICD-10-CM | POA: Insufficient documentation

## 2012-04-12 DIAGNOSIS — Z0000 Encounter for general adult medical examination without abnormal findings: Secondary | ICD-10-CM

## 2012-04-12 DIAGNOSIS — I10 Essential (primary) hypertension: Secondary | ICD-10-CM | POA: Insufficient documentation

## 2012-04-12 DIAGNOSIS — M1990 Unspecified osteoarthritis, unspecified site: Secondary | ICD-10-CM | POA: Insufficient documentation

## 2012-04-12 LAB — CBC WITH DIFFERENTIAL/PLATELET
Basophils Absolute: 0 10*3/uL (ref 0.0–0.1)
Basophils Relative: 0 % (ref 0–1)
Eosinophils Absolute: 0.2 10*3/uL (ref 0.0–0.7)
Hemoglobin: 14.3 g/dL (ref 13.0–17.0)
MCHC: 34 g/dL (ref 30.0–36.0)
Neutro Abs: 4.1 10*3/uL (ref 1.7–7.7)
Neutrophils Relative %: 56 % (ref 43–77)
Platelets: 271 10*3/uL (ref 150–400)
RDW: 13.2 % (ref 11.5–15.5)

## 2012-04-12 LAB — COMPREHENSIVE METABOLIC PANEL
ALT: 25 U/L (ref 0–53)
AST: 22 U/L (ref 0–37)
BUN: 15 mg/dL (ref 6–23)
Calcium: 9.4 mg/dL (ref 8.4–10.5)
Creat: 1.21 mg/dL (ref 0.50–1.35)
Total Bilirubin: 0.6 mg/dL (ref 0.3–1.2)

## 2012-04-12 LAB — LIPID PANEL
Cholesterol: 139 mg/dL (ref 0–200)
HDL: 36 mg/dL — ABNORMAL LOW (ref >39)
Total CHOL/HDL Ratio: 3.9 Ratio
VLDL: 25 mg/dL (ref 0–40)

## 2012-04-12 LAB — POCT GLYCOSYLATED HEMOGLOBIN (HGB A1C): Hemoglobin A1C: 6.3

## 2012-04-12 MED ORDER — PRAVASTATIN SODIUM 40 MG PO TABS: 40.0000 mg | ORAL_TABLET | Freq: Every day | ORAL | Status: DC

## 2012-04-12 MED ORDER — OLMESARTAN MEDOXOMIL 20 MG PO TABS: 20.0000 mg | ORAL_TABLET | Freq: Every day | ORAL | Status: DC

## 2012-04-12 NOTE — Progress Notes (Signed)
@UMFCLOGO @  Patient ID: Daniel Ramirez MRN: [id number], DOB: 09-May-1951 61 y.o. Date of Encounter: 04/12/2012, 5:31 PM  Primary Physician: No primary provider on file.  Chief Complaint: Physical (CPE)  HPI: 61 y.o. y/o male with history noted below here for CPE.  Doing well. No issues/complaints.  Review of Systems:  Consitutional: No fever, chills, fatigue, night sweats, lymphadenopathy, or weight changes. Eyes: No visual changes, eye redness, or discharge. ENT/Mouth: Ears: No otalgia, tinnitus, hearing loss, discharge. Nose: No congestion, rhinorrhea, sinus pain, or epistaxis. Throat: No sore throat, post nasal drip, or teeth pain. Cardiovascular: No CP, palpitations, diaphoresis, DOE, edema, orthopnea, PND. Respiratory: No cough, hemoptysis, SOB, or wheezing. Gastrointestinal: No anorexia, dysphagia, reflux, pain, nausea, vomiting, hematemesis, diarrhea, constipation, BRBPR, or melena. Genitourinary: No dysuria, frequency, urgency, hematuria, incontinence, nocturia, decreased urinary stream, discharge, impotence, or testicular pain/masses. Musculoskeletal: No decreased ROM, myalgias, stiffness, joint swelling, or weakness. Skin: No rash, erythema, lesion changes, pain, warmth, jaundice, or pruritis. Neurological: No headache, dizziness, syncope, seizures, tremors, memory loss, coordination problems, or paresthesias. Psychological: No anxiety, depression, hallucinations, SI/HI. Endocrine: No fatigue, polydipsia, polyphagia, polyuria, or known diabetes. All other systems were reviewed and are otherwise negative.  No past medical history on file.   No past surgical history on file.  Home Meds:  Prior to Admission medications   Medication Sig Start Date End Date Taking? Authorizing Provider  aspirin 81 MG tablet Take 81 mg by mouth daily.   Yes Historical Provider, MD  cetirizine (ZYRTEC) 10 MG tablet Take 10 mg by mouth daily.   Yes Historical Provider, MD  GLUCOSAMINE PO Take by  mouth daily.   Yes Historical Provider, MD  Multiple Vitamin (MULTIVITAMIN) tablet Take 1 tablet by mouth daily.   Yes Historical Provider, MD  olmesartan (BENICAR) 20 MG tablet Take 20 mg by mouth daily.   Yes Historical Provider, MD  pravastatin (PRAVACHOL) 40 MG tablet Take 40 mg by mouth daily.   Yes Historical Provider, MD    Allergies: No Known Allergies  History   Social History  . Marital Status: Single    Spouse Name: N/A    Number of Children: N/A  . Years of Education: N/A   Occupational History  . Not on file.   Social History Main Topics  . Smoking status: Former Smoker    Types: Cigarettes    Quit date: 01/31/1969  . Smokeless tobacco: Not on file  . Alcohol Use: No  . Drug Use: Not on file  . Sexually Active: Not on file   Other Topics Concern  . Not on file   Social History Narrative  . No narrative on file    No family history on file.  Physical Exam  Blood pressure 122/78, pulse 87, temperature 98 F (36.7 C), temperature source Oral, resp. rate 16, height 5\' 11"  (1.803 m), weight 257 lb 6.4 oz (116.756 kg).  General: Well developed, well nourished, in no acute distress. HEENT: Normocephalic, atraumatic. Conjunctiva pink, sclera non-icteric. Pupils 2 mm constricting to 1 mm, round, regular, and equally reactive to light and accomodation. EOMI. Internal auditory canal clear. TMs with good cone of light and without pathology. Nasal mucosa pink. Nares are without discharge. No sinus tenderness. Oral mucosa pink. Dentition  Pharynx without exudate.   Neck: Supple. Trachea midline. No thyromegaly. Full ROM. No lymphadenopathy. Lungs: Clear to auscultation bilaterally without wheezes, rales, or rhonchi. Breathing is of normal effort and unlabored. Cardiovascular: RRR with S1 S2. No murmurs, rubs, or gallops  appreciated. Distal pulses 2+ symmetrically. No carotid or abdominal bruits  Abdomen: Soft, non-tender, non-distended with normoactive bowel sounds. No  hepatosplenomegaly or masses. No rebound/guarding. No CVA tenderness. Without hernias.  Rectal: No external hemorrhoids or fissures. Rectal vault without masses.  Genitourinary:   circumcised male. No penile lesions. Testes descended bilaterally, and smooth without tenderness or masses.  Musculoskeletal: Full range of motion and 5/5 strength throughout. Without swelling, atrophy, tenderness, crepitus, or warmth. Extremities without clubbing, cyanosis, or edema. Calves supple. Skin: Warm and moist without erythema, ecchymosis, wounds, or rash. Neuro: A+Ox3. CN II-XII grossly intact. Moves all extremities spontaneously. Full sensation throughout. Normal gait. DTR 2+ throughout upper and lower extremities. Finger to nose intact. Psych:  Responds to questions appropriately with a normal affect.   Studies: CBC, CMET, Lipid, PSA, TSH, Vitamin D all pending.    Assessment/Plan:  61 y.o. y/o  male here for CPE patient has a history of high blood high cholesterol. He also has significant arthritis in his left knee for which he takes glucosamine. He overall is doing well he tends to run high sugar with minimal exercise but has a very strenuous job is been year since we have done a stress test. His baseline EKG shows right bundle branch block but because of his risk factors and age I will go ahead and order a cardiology refer  -  Signed, Earl Lites, MD 04/12/2012 5:31 PM

## 2012-04-13 LAB — PSA: PSA: 0.74 ng/mL (ref <=4.00)

## 2012-05-20 ENCOUNTER — Telehealth: Payer: Self-pay

## 2012-05-20 NOTE — Telephone Encounter (Signed)
Called [employer] and spoke with [place].  She will re-file labs.  Advised patient.

## 2012-08-02 ENCOUNTER — Ambulatory Visit (INDEPENDENT_AMBULATORY_CARE_PROVIDER_SITE_OTHER): Payer: BC Managed Care – PPO | Admitting: Emergency Medicine

## 2012-08-02 VITALS — BP 106/67 | HR 67 | Temp 98.0°F | Resp 16 | Ht 70.5 in | Wt 242.0 lb

## 2012-08-02 DIAGNOSIS — Q2112 Patent foramen ovale: Secondary | ICD-10-CM

## 2012-08-02 DIAGNOSIS — R739 Hyperglycemia, unspecified: Secondary | ICD-10-CM

## 2012-08-02 DIAGNOSIS — Z79899 Other long term (current) drug therapy: Secondary | ICD-10-CM

## 2012-08-02 DIAGNOSIS — R7309 Other abnormal glucose: Secondary | ICD-10-CM

## 2012-08-02 DIAGNOSIS — E785 Hyperlipidemia, unspecified: Secondary | ICD-10-CM

## 2012-08-02 DIAGNOSIS — Q211 Atrial septal defect: Secondary | ICD-10-CM | POA: Insufficient documentation

## 2012-08-02 LAB — LIPID PANEL
HDL: 36 mg/dL — ABNORMAL LOW (ref >39)
LDL Cholesterol: 70 mg/dL (ref 0–99)
Triglycerides: 90 mg/dL (ref <150)
VLDL: 18 mg/dL (ref 0–40)

## 2012-08-02 LAB — GLUCOSE, POCT (MANUAL RESULT ENTRY): POC Glucose: 109 mg/dl — AB (ref 70–99)

## 2012-08-02 NOTE — Progress Notes (Signed)
  Subjective:    Patient ID: Daniel Ramirez, male    DOB: 04/23/1951, 61 y.o.   MRN: [id number]  HPI problem #1 hypertension patient has been on a diet and losing weight and feels that his blood pressure is doing better.  Problem #2 is high cholesterol. He's been taking his Pravachol regularly and losing weight and exercising..  Problem #3 is RBBB.Marland Kitchen He has had no chest pain shortness of breath. He recently completed his evaluation with Dr. Jacinto Halim. He has not had coronary disease but by echo was found to have a PFO which did not need treatment.  Problem #4 pain in his knee. He has been taking chondroitin but does not feel like it is helping but he is doing okay with his pain right now.    Review of Systems     Objective:   Physical Exam HEENT exam is unremarkable. His neck is supple. Auscultation and percussion. Cardiac exam is unremarkable. Examination of feet reveals normal filament test dorsalis pedis and posterior tibial pulses are 2+ and symmetrical.  Results for orders placed in visit on 08/02/12  GLUCOSE, POCT (MANUAL RESULT ENTRY)      Component Value Range   POC Glucose 109 (*) 70 - 99 mg/dl  POCT GLYCOSYLATED HEMOGLOBIN (HGB A1C)      Component Value Range   Hemoglobin A1C 5.9          Assessment & Plan:  Patient declines a flu shot.. hemoglobin A1c is 5.9 we'll continue with his current treatment program of diet weight loss and exercise. We'll continue his Benicar 20 mg a half tablet a day. We'll continue Pravachol for now unless his cholesterol is a lot better with his weight loss and then could consider decreasing the dose or stopping it.

## 2012-11-11 ENCOUNTER — Other Ambulatory Visit: Payer: Self-pay | Admitting: Emergency Medicine

## 2012-11-11 NOTE — Telephone Encounter (Signed)
Too soon for renewal of this, was sent in June with refills.

## 2012-11-14 ENCOUNTER — Other Ambulatory Visit: Payer: Self-pay | Admitting: *Deleted

## 2012-11-14 MED ORDER — PRAVASTATIN SODIUM 40 MG PO TABS: 40.0000 mg | ORAL_TABLET | Freq: Every day | ORAL | Status: DC

## 2012-11-15 ENCOUNTER — Telehealth: Payer: Self-pay

## 2012-11-15 MED ORDER — OLMESARTAN MEDOXOMIL 20 MG PO TABS: 20.0000 mg | ORAL_TABLET | Freq: Every day | ORAL | Status: DC

## 2012-11-15 NOTE — Telephone Encounter (Signed)
Daniel Ramirez WOULD LIKE TO SPEAK WITH SOMEONE REGARDING ONE OF HER HUSBAND'S MEDICINE. STATES THEY ARE HAVING PROBLEMS WITH THE PHARMACY. PLEASE CALL [phone]

## 2012-11-15 NOTE — Telephone Encounter (Signed)
Called her to advise, she needs 90 day supply of Benicar, so this was sent for him.

## 2012-12-06 ENCOUNTER — Encounter: Payer: Self-pay | Admitting: Emergency Medicine

## 2012-12-06 ENCOUNTER — Ambulatory Visit (INDEPENDENT_AMBULATORY_CARE_PROVIDER_SITE_OTHER): Payer: BC Managed Care – PPO | Admitting: Emergency Medicine

## 2012-12-06 VITALS — BP 106/67 | HR 77 | Temp 97.2°F | Resp 16 | Ht 71.0 in | Wt 238.0 lb

## 2012-12-06 DIAGNOSIS — R7309 Other abnormal glucose: Secondary | ICD-10-CM

## 2012-12-06 DIAGNOSIS — E785 Hyperlipidemia, unspecified: Secondary | ICD-10-CM

## 2012-12-06 LAB — POCT GLYCOSYLATED HEMOGLOBIN (HGB A1C): Hemoglobin A1C: 5.9

## 2012-12-06 LAB — LIPID PANEL
Cholesterol: 130 mg/dL (ref 0–200)
HDL: 41 mg/dL (ref >39)
LDL Cholesterol: 68 mg/dL (ref 0–99)
Total CHOL/HDL Ratio: 3.2 Ratio
Triglycerides: 105 mg/dL (ref <150)
VLDL: 21 mg/dL (ref 0–40)

## 2012-12-06 NOTE — Progress Notes (Signed)
  Subjective:    Patient ID: Daniel Ramirez, male    DOB: 10-Jul-1951, 62 y.o.   MRN: [id number]  HPI patient states he feels good. He is taking his medications regularly. He has no complaints at the present time. He's asking about when he last had a DTaP    Review of Systems     Objective:   Physical Exam HEENT exam is unremarkable neck supple chest clear heart regular rate no murmurs examination of the feet reveal no lesions . Results for orders placed in visit on 12/06/12  GLUCOSE, POCT (MANUAL RESULT ENTRY)      Component Value Range   POC Glucose 116 (*) 70 - 99 mg/dl  POCT GLYCOSYLATED HEMOGLOBIN (HGB A1C)      Component Value Range   Hemoglobin A1C 5.9           Assessment & Plan:     Patient doing well at present. His hemoglobin A1c is at goal. Lipid  panel was done. recheck 4 months

## 2013-01-31 ENCOUNTER — Other Ambulatory Visit: Payer: Self-pay | Admitting: Emergency Medicine

## 2013-01-31 ENCOUNTER — Encounter: Payer: Self-pay | Admitting: Emergency Medicine

## 2013-02-03 ENCOUNTER — Encounter: Payer: Self-pay | Admitting: Emergency Medicine

## 2013-03-11 ENCOUNTER — Other Ambulatory Visit: Payer: Self-pay | Admitting: Physician Assistant

## 2013-03-12 NOTE — Telephone Encounter (Signed)
Previous note from Dr. Cleta Alberts indicates pt taking 1/2 tab of 20mg  daily, but sig indicates 1 tab daily.  How is pt taking?  "We'll continue his Benicar 20 mg a half tablet a day." from encounter dated 08/02/2012

## 2013-03-13 NOTE — Telephone Encounter (Signed)
Pt clarified that Dr Cleta Alberts advised him to just take 1/2 tab QD and that is how he has been taking it. Do you want to write a new Rx w/updated sig?

## 2013-03-14 NOTE — Telephone Encounter (Signed)
Sent to pharmacy with update sig

## 2013-04-11 ENCOUNTER — Ambulatory Visit (INDEPENDENT_AMBULATORY_CARE_PROVIDER_SITE_OTHER): Payer: BC Managed Care – PPO | Admitting: Emergency Medicine

## 2013-04-11 VITALS — BP 105/72 | HR 72 | Temp 97.1°F | Resp 18 | Ht 72.5 in | Wt 245.0 lb

## 2013-04-11 DIAGNOSIS — E785 Hyperlipidemia, unspecified: Secondary | ICD-10-CM

## 2013-04-11 DIAGNOSIS — I10 Essential (primary) hypertension: Secondary | ICD-10-CM

## 2013-04-11 DIAGNOSIS — E119 Type 2 diabetes mellitus without complications: Secondary | ICD-10-CM

## 2013-04-11 LAB — LIPID PANEL: LDL Cholesterol: 68 mg/dL (ref 0–99)

## 2013-04-11 LAB — POCT GLYCOSYLATED HEMOGLOBIN (HGB A1C): Hemoglobin A1C: 5.9

## 2013-04-11 NOTE — Progress Notes (Signed)
  Subjective:    Patient ID: Daniel Ramirez, male    DOB: [birth year], 62 y.o.   MRN: [id number]  HPI Here for fu hypertension, hyperlipidemia, and hyperglycemia    Review of Systems  Constitutional: Negative.   HENT: Negative.   Respiratory: Negative.   Cardiovascular: Negative.   Gastrointestinal: Negative.   Endocrine: Negative.   Musculoskeletal:       Plantar fasciitis right heel       Objective:   Physical Exam  Constitutional: He appears well-developed.  HENT:  Head: Normocephalic.  Cardiovascular: Normal rate and regular rhythm.   Pulmonary/Chest: Effort normal and breath sounds normal. No respiratory distress.  Musculoskeletal:  Tender right heel   Results for orders placed in visit on 04/11/13  GLUCOSE, POCT (MANUAL RESULT ENTRY)      Result Value Range   POC Glucose 108 (*) 70 - 99 mg/dl  POCT GLYCOSYLATED HEMOGLOBIN (HGB A1C)      Result Value Range   Hemoglobin A1C 5.9           Assessment & Plan:  PE in 4 months

## 2013-05-11 ENCOUNTER — Other Ambulatory Visit: Payer: Self-pay

## 2013-08-06 ENCOUNTER — Other Ambulatory Visit: Payer: Self-pay | Admitting: Emergency Medicine

## 2013-08-22 ENCOUNTER — Encounter: Payer: Self-pay | Admitting: Emergency Medicine

## 2013-08-22 ENCOUNTER — Ambulatory Visit (INDEPENDENT_AMBULATORY_CARE_PROVIDER_SITE_OTHER): Payer: BC Managed Care – PPO | Admitting: Emergency Medicine

## 2013-08-22 VITALS — BP 110/70 | HR 70 | Temp 97.8°F | Resp 16 | Ht 71.0 in | Wt 247.8 lb

## 2013-08-22 DIAGNOSIS — I10 Essential (primary) hypertension: Secondary | ICD-10-CM

## 2013-08-22 DIAGNOSIS — R739 Hyperglycemia, unspecified: Secondary | ICD-10-CM

## 2013-08-22 DIAGNOSIS — K921 Melena: Secondary | ICD-10-CM

## 2013-08-22 DIAGNOSIS — E785 Hyperlipidemia, unspecified: Secondary | ICD-10-CM

## 2013-08-22 DIAGNOSIS — R7309 Other abnormal glucose: Secondary | ICD-10-CM

## 2013-08-22 DIAGNOSIS — Z0000 Encounter for general adult medical examination without abnormal findings: Secondary | ICD-10-CM

## 2013-08-22 LAB — GLUCOSE, POCT (MANUAL RESULT ENTRY): POC Glucose: 127 mg/dl — AB (ref 70–99)

## 2013-08-22 LAB — CBC WITH DIFFERENTIAL/PLATELET
Basophils Absolute: 0 10*3/uL (ref 0.0–0.1)
Basophils Relative: 0 % (ref 0–1)
Eosinophils Relative: 3 % (ref 0–5)
HCT: 42.5 % (ref 39.0–52.0)
Lymphocytes Relative: 26 % (ref 12–46)
MCHC: 34.6 g/dL (ref 30.0–36.0)
MCV: 92.4 fL (ref 78.0–100.0)
Monocytes Absolute: 0.5 10*3/uL (ref 0.1–1.0)
Neutro Abs: 4.6 10*3/uL (ref 1.7–7.7)
Platelets: 260 10*3/uL (ref 150–400)
RDW: 12.6 % (ref 11.5–15.5)
WBC: 7.1 10*3/uL (ref 4.0–10.5)

## 2013-08-22 LAB — COMPREHENSIVE METABOLIC PANEL
ALT: 20 U/L (ref 0–53)
AST: 20 U/L (ref 0–37)
Alkaline Phosphatase: 61 U/L (ref 39–117)
BUN: 18 mg/dL (ref 6–23)
Chloride: 102 mEq/L (ref 96–112)
Creat: 1.31 mg/dL (ref 0.50–1.35)

## 2013-08-22 LAB — PSA: PSA: 0.63 ng/mL (ref <=4.00)

## 2013-08-22 LAB — POCT URINALYSIS DIPSTICK
Leukocytes, UA: NEGATIVE
Protein, UA: NEGATIVE
Urobilinogen, UA: 0.2
pH, UA: 5

## 2013-08-22 LAB — IFOBT (OCCULT BLOOD): IFOBT: POSITIVE

## 2013-08-22 LAB — LIPID PANEL
LDL Cholesterol: 78 mg/dL (ref 0–99)
Total CHOL/HDL Ratio: 3.5 Ratio
Triglycerides: 105 mg/dL (ref <150)

## 2013-08-22 MED ORDER — OLMESARTAN MEDOXOMIL 20 MG PO TABS: 10.0000 mg | ORAL_TABLET | Freq: Every day | ORAL | Status: DC

## 2013-08-22 MED ORDER — PRAVASTATIN SODIUM 40 MG PO TABS: ORAL_TABLET | ORAL | Status: DC

## 2013-08-22 NOTE — Progress Notes (Signed)
  Subjective:    Patient ID: Daniel Ramirez, male    DOB: 07/14/1951, 62 y.o.   MRN: [id number]  HPI patient is a 62 year old male here for physical exam to    Review of Systems  Constitutional: Negative.   HENT: Positive for congestion and postnasal drip.   Eyes: Negative.   Respiratory: Negative.   Cardiovascular: Negative.   Gastrointestinal: Negative.   Endocrine: Negative.   Genitourinary: Negative.   Musculoskeletal: Negative.   Skin: Negative.   Allergic/Immunologic: Negative.   Neurological: Negative.   Hematological: Negative.   Psychiatric/Behavioral: Negative.    he has chronic knee problems and battles with plantar fasciitis but recently this has been better     Objective:   Physical Exam H. EENT exam reveals a cataract of the left eye with a hard contact lens. His chest is clear heart regular rate no murmurs there are no carotid bruits his abdomen is soft liver and spleen not enlarged no areas of tenderness rectal exam is normal prostate pulses are 2+ and symmetrical of the lower extremities to knee exam reveals significant degenerative changes bilaterally.  Results for orders placed in visit on 08/22/13  IFOBT (OCCULT BLOOD)      Result Value Range   IFOBT Positive    GLUCOSE, POCT (MANUAL RESULT ENTRY)      Result Value Range   POC Glucose 127 (*) 70 - 99 mg/dl  POCT GLYCOSYLATED HEMOGLOBIN (HGB A1C)      Result Value Range   Hemoglobin A1C 6.0    POCT URINALYSIS DIPSTICK      Result Value Range   Color, UA yellow     Clarity, UA clear     Glucose, UA neg     Bilirubin, UA neg     Ketones, UA neg     Spec Grav, UA 1.015     Blood, UA trace-lysed     pH, UA 5.0     Protein, UA neg     Urobilinogen, UA 0.2     Nitrite, UA neg     Leukocytes, UA Negative          Assessment & Plan:  Routine labs were done today. He declined a flu shot he declined the shingles vaccine. Will recheck in, 4 months. He recently completed his evaluation with Dr. Jacinto Halim

## 2013-08-23 ENCOUNTER — Encounter: Payer: Self-pay | Admitting: Gastroenterology

## 2013-09-07 ENCOUNTER — Ambulatory Visit (INDEPENDENT_AMBULATORY_CARE_PROVIDER_SITE_OTHER): Payer: BC Managed Care – PPO | Admitting: Gastroenterology

## 2013-09-07 ENCOUNTER — Encounter: Payer: Self-pay | Admitting: Gastroenterology

## 2013-09-07 VITALS — BP 114/72 | HR 84 | Ht 71.0 in | Wt 254.0 lb

## 2013-09-07 DIAGNOSIS — R195 Other fecal abnormalities: Secondary | ICD-10-CM

## 2013-09-07 MED ORDER — PEG-KCL-NACL-NASULF-NA ASC-C 100 G PO SOLR: 1.0000 | Freq: Once | ORAL | Status: DC

## 2013-09-07 NOTE — Progress Notes (Signed)
    History of Present Illness: This is a 62 year old male with iFOT positive stool. He states he history of hemorrhoids but does not note any hemorrhoid symptoms. He underwent colonoscopy by Dr. Sherin Quarry in October 2008 showing sigmoid colon and descending colon diverticulosis. He was recommended to have a 10 year followup colonoscopy. Denies weight loss, abdominal pain, constipation, diarrhea, change in stool caliber, melena, hematochezia, nausea, vomiting, dysphagia, reflux symptoms, chest pain.  Review of Systems: Pertinent positive and negative review of systems were noted in the above HPI section. All other review of systems were otherwise negative.  Current Medications, Allergies, Past Medical History, Past Surgical History, Family History and Social History were reviewed in [employer] record.  Physical Exam: General: Well developed , well nourished, no acute distress Head: Normocephalic and atraumatic Eyes:  sclerae anicteric, EOMI Ears: Normal auditory acuity Mouth: No deformity or lesions Neck: Supple, no masses or thyromegaly Lungs: Clear throughout to auscultation Heart: Regular rate and rhythm; no murmurs, rubs or bruits Abdomen: Soft, non tender and non distended. No masses, hepatosplenomegaly or hernias noted. Normal Bowel sounds Rectal: Deferred to colonoscopy, DRE by Dr. Cleta Alberts was unremarkable Musculoskeletal: Symmetrical with no gross deformities  Skin: No lesions on visible extremities Pulses:  Normal pulses noted Extremities: No clubbing, cyanosis, edema or deformities noted Neurological: Alert oriented x 4, grossly nonfocal Cervical Nodes:  No significant cervical adenopathy Inguinal Nodes: No significant inguinal adenopathy Psychological:  Alert and cooperative. Normal mood and affect  Assessment and Recommendations:  1. Heme positive stool. Diverticulosis. Rule out colorectal neoplasms, hemorrhoids and other disorders. Colonoscopy. The  risks, benefits, and alternatives to colonoscopy with possible biopsy and possible polypectomy were discussed with the patient and they consent to proceed.

## 2013-09-07 NOTE — Patient Instructions (Signed)
You have been scheduled for a colonoscopy with propofol. Please follow written instructions given to you at your visit today.  Please pick up your prep kit at the pharmacy within the next 1-3 days. If you use inhalers (even only as needed), please bring them with you on the day of your procedure. Your physician has requested that you go to www.startemmi.com and enter the access code given to you at your visit today. This web site gives a general overview about your procedure. However, you should still follow specific instructions given to you by our office regarding your preparation for the procedure.  Thank you for choosing me and [facility].  Malcolm T. Stark, Jr., MD., FACG  

## 2013-11-23 ENCOUNTER — Ambulatory Visit (AMBULATORY_SURGERY_CENTER): Payer: BC Managed Care – PPO | Admitting: Gastroenterology

## 2013-11-23 ENCOUNTER — Encounter: Payer: Self-pay | Admitting: Gastroenterology

## 2013-11-23 VITALS — BP 136/74 | HR 60 | Temp 98.3°F | Resp 15 | Ht 71.0 in | Wt 254.0 lb

## 2013-11-23 DIAGNOSIS — R195 Other fecal abnormalities: Secondary | ICD-10-CM

## 2013-11-23 MED ORDER — SODIUM CHLORIDE 0.9 % IV SOLN: 500.0000 mL | INTRAVENOUS | Status: DC

## 2013-11-23 NOTE — Progress Notes (Signed)
Patient did not experience any of the following events: a burn prior to discharge; a fall within the facility; wrong site/side/patient/procedure/implant event; or a hospital transfer or hospital admission upon discharge from the facility. (G8907)Patient did not have preoperative order for IV antibiotic SSI prophylaxis. (G8918) ewm 

## 2013-11-23 NOTE — Op Note (Signed)
[place]  [address] [place], [address]   COLONOSCOPY PROCEDURE REPORT  PATIENT: Daniel Ramirez, Daniel Ramirez  MR#: [id number] BIRTHDATE: September 22, 1951 , 38  yrs. old GENDER: Male ENDOSCOPIST: Ladene Artist, MD, [facility] REFERRED NO:[facility] Everlene Farrier, M.D. PROCEDURE DATE:  11/23/2013 PROCEDURE:   Colonoscopy, diagnostic First Screening Colonoscopy - Avg.  risk and is 50 yrs.  old or older - No.  Prior Negative Screening - Now for repeat screening. N/A  History of Adenoma - Now for follow-up colonoscopy & has been > or = to 3 yrs.  N/A  Polyps Removed Today? No.  Recommend repeat exam, <10 yrs? No. ASA CLASS:   Class II INDICATIONS:heme-positive stool. MEDICATIONS: MAC sedation, administered by CRNA and propofol (Diprivan) 300mg  IV DESCRIPTION OF PROCEDURE:   After the risks benefits and alternatives of the procedure were thoroughly explained, informed consent was obtained.  A digital rectal exam revealed no abnormalities of the rectum.   The [id number]  endoscope was introduced through the anus and advanced to the cecum, which was identified by both the appendix and ileocecal valve. No adverse events experienced.   The quality of the prep was good, using MoviPrep  The instrument was then slowly withdrawn as the colon was fully examined.  COLON FINDINGS: Moderate diverticulosis was noted in the descending colon and sigmoid colon.   The colon was otherwise normal.  There was no diverticulosis, inflammation, polyps or cancers unless previously stated.  Retroflexed views revealed small internal hemorrhoids. The time to cecum=3 minutes 22 seconds.  Withdrawal time=10 minutes 12 seconds.  The scope was withdrawn and the procedure completed.  COMPLICATIONS: There were no complications.  ENDOSCOPIC IMPRESSION: 1.   Moderate diverticulosis in the descending colon and sigmoid colon 2.   Small internal hemorrhoids  RECOMMENDATIONS: 1.  High fiber diet with liberal fluid  intake. 2.  You should continue to follow colorectal cancer screening guidelines for "routine risk" patients with a repeat colonoscopy in 10 years.  There is no need for routine screening FOBT (stool) testing for at least 5 years.  eSigned:  Ladene Artist, MD, [facility] 11/23/2013 8:31 AM

## 2013-11-23 NOTE — Patient Instructions (Signed)
YOU HAD AN ENDOSCOPIC PROCEDURE TODAY AT THE [facility]: Refer to the procedure report that was given to you for any specific questions about what was found during the examination.  If the procedure report does not answer your questions, please call your gastroenterologist to clarify.  If you requested that your care partner not be given the details of your procedure findings, then the procedure report has been included in a sealed envelope for you to review at your convenience later.  YOU SHOULD EXPECT: Some feelings of bloating in the abdomen. Passage of more gas than usual.  Walking can help get rid of the air that was put into your GI tract during the procedure and reduce the bloating. If you had a lower endoscopy (such as a colonoscopy or flexible sigmoidoscopy) you may notice spotting of blood in your stool or on the toilet paper. If you underwent a bowel prep for your procedure, then you may not have a normal bowel movement for a few days.  DIET: Your first meal following the procedure should be a light meal and then it is ok to progress to your normal diet.  A half-sandwich or bowl of soup is an example of a good first meal.  Heavy or fried foods are harder to digest and may make you feel nauseous or bloated.  Likewise meals heavy in dairy and vegetables can cause extra gas to form and this can also increase the bloating.  Drink plenty of fluids but you should avoid alcoholic beverages for 24 hours.  ACTIVITY: Your care partner should take you home directly after the procedure.  You should plan to take it easy, moving slowly for the rest of the day.  You can resume normal activity the day after the procedure however you should NOT DRIVE or use heavy machinery for 24 hours (because of the sedation medicines used during the test).    SYMPTOMS TO REPORT IMMEDIATELY: A gastroenterologist can be reached at any hour.  During normal business hours, 8:30 AM to 5:00 PM Monday through Friday,  call [phone].  After hours and on weekends, please call the GI answering service at [phone] who will take a message and have the physician on call contact you.   Following lower endoscopy (colonoscopy or flexible sigmoidoscopy):  Excessive amounts of blood in the stool  Significant tenderness or worsening of abdominal pains  Swelling of the abdomen that is new, acute  Fever of 100F or higher  FOLLOW UP: If any biopsies were taken you will be contacted by phone or by letter within the next 1-3 weeks.  Call your gastroenterologist if you have not heard about the biopsies in 3 weeks.  Our staff will call the home number listed on your records the next business day following your procedure to check on you and address any questions or concerns that you may have at that time regarding the information given to you following your procedure. This is a courtesy call and so if there is no answer at the home number and we have not heard from you through the emergency physician on call, we will assume that you have returned to your regular daily activities without incident.  SIGNATURES/CONFIDENTIALITY: You and/or your care partner have signed paperwork which will be entered into your electronic medical record.  These signatures attest to the fact that that the information above on your After Visit Summary has been reviewed and is understood.  Full responsibility of the confidentiality of this   discharge information lies with you and/or your care-partner.  Handouts on hemorrhoids, diverticulosis, high fiber diet Repeat colon in 10 years

## 2013-11-23 NOTE — Progress Notes (Signed)
Lidocaine-40mg IV prior to Propofol InductionPropofol given over incremental dosages 

## 2013-11-24 ENCOUNTER — Telehealth: Payer: Self-pay | Admitting: *Deleted

## 2013-11-24 NOTE — Telephone Encounter (Signed)
  Follow up Call-  Call back number 11/23/2013  Post procedure Call Back phone  # [phone]  Permission to leave phone message Yes     Patient questions:  Do you have a fever, pain , or abdominal swelling? no Pain Score  0 *  Have you tolerated food without any problems? yes  Have you been able to return to your normal activities? yes  Do you have any questions about your discharge instructions: Diet   no Medications  no Follow up visit  no  Do you have questions or concerns about your Care? no  Actions: * If pain score is 4 or above: No action needed, pain <4.

## 2013-12-12 ENCOUNTER — Ambulatory Visit (INDEPENDENT_AMBULATORY_CARE_PROVIDER_SITE_OTHER): Payer: BC Managed Care – PPO | Admitting: Emergency Medicine

## 2013-12-12 ENCOUNTER — Encounter: Payer: Self-pay | Admitting: Emergency Medicine

## 2013-12-12 VITALS — BP 120/76 | HR 76 | Temp 98.1°F | Resp 16 | Ht 72.0 in | Wt 251.0 lb

## 2013-12-12 DIAGNOSIS — I10 Essential (primary) hypertension: Secondary | ICD-10-CM

## 2013-12-12 DIAGNOSIS — E7800 Pure hypercholesterolemia, unspecified: Secondary | ICD-10-CM

## 2013-12-12 DIAGNOSIS — R7309 Other abnormal glucose: Secondary | ICD-10-CM

## 2013-12-12 DIAGNOSIS — R739 Hyperglycemia, unspecified: Secondary | ICD-10-CM

## 2013-12-12 LAB — LIPID PANEL
CHOL/HDL RATIO: 3.2 ratio
Cholesterol: 127 mg/dL (ref 0–200)
HDL: 40 mg/dL (ref >39)
LDL CALC: 65 mg/dL (ref 0–99)
Triglycerides: 110 mg/dL (ref <150)
VLDL: 22 mg/dL (ref 0–40)

## 2013-12-12 LAB — COMPLETE METABOLIC PANEL WITH GFR
ALBUMIN: 4.4 g/dL (ref 3.5–5.2)
ALK PHOS: 61 U/L (ref 39–117)
ALT: 22 U/L (ref 0–53)
AST: 21 U/L (ref 0–37)
BILIRUBIN TOTAL: 0.7 mg/dL (ref 0.2–1.2)
BUN: 16 mg/dL (ref 6–23)
CO2: 26 mEq/L (ref 19–32)
Calcium: 9.6 mg/dL (ref 8.4–10.5)
Chloride: 102 mEq/L (ref 96–112)
Creat: 1.28 mg/dL (ref 0.50–1.35)
GFR, Est African American: 68 mL/min
GFR, Est Non African American: 59 mL/min — ABNORMAL LOW
Glucose, Bld: 115 mg/dL — ABNORMAL HIGH (ref 70–99)
POTASSIUM: 4.3 meq/L (ref 3.5–5.3)
SODIUM: 138 meq/L (ref 135–145)
TOTAL PROTEIN: 6.8 g/dL (ref 6.0–8.3)

## 2013-12-12 LAB — GLUCOSE, POCT (MANUAL RESULT ENTRY): POC Glucose: 119 mg/dl — AB (ref 70–99)

## 2013-12-12 LAB — POCT GLYCOSYLATED HEMOGLOBIN (HGB A1C): HEMOGLOBIN A1C: 6.3

## 2013-12-12 NOTE — Progress Notes (Addendum)
Subjective:    Patient ID: Daniel Ramirez, male    DOB: 12/14/1950, 63 y.o.   MRN: [id number] This chart was scribed for [name], MD by Anastasia Pall, ED Scribe. This patient was seen in room 23 and the patient's care was started at 9:17 AM.  Chief Complaint  Patient presents with  . Follow-up    3 month   HPI Daniel Ramirez is a 63 y.o. male Pt presents to the [facility] for 63-month follow up.   He states he is well, his family is well. He reports having done his colonoscopy as recommended. He denies any symptoms.   PCP - Jenny Reichmann, MD  Patient Active Problem List   Diagnosis Date Noted  . PFO (patent foramen ovale) 08/02/2012  . Hypertension 04/12/2012  . Hyperlipidemia 04/12/2012  . Hyperglycemia 04/12/2012  . RBBB 04/12/2012  . Arthritis 04/12/2012   Past Medical History  Diagnosis Date  . Allergy   . Arthritis   . Cataract   . Cardiac arrhythmia   . Gallstones   . HTN (hypertension)   . Pneumonia    Past Surgical History  Procedure Laterality Date  . Cholecystectomy  2011  . Retinal detachment surgery Right     x 2  . Cataract extraction Right   . Yag laser application Right   . Knee cartilage surgery Right     x 2  . Knee arthroscopy Right   . Tonsillectomy     No Known Allergies Prior to Admission medications   Medication Sig Start Date End Date Taking? Authorizing Provider  aspirin 81 MG tablet Take 81 mg by mouth daily.    Historical Provider, MD  cetirizine (ZYRTEC) 10 MG tablet Take 10 mg by mouth daily.    Historical Provider, MD  Multiple Vitamin (MULTIVITAMIN) tablet Take 1 tablet by mouth daily.    Historical Provider, MD  olmesartan (BENICAR) 20 MG tablet Take 0.5 tablets (10 mg total) by mouth daily. 08/22/13   [name], MD  pravastatin (PRAVACHOL) 40 MG tablet TAKE 1 TABLET BY MOUTH EVERY DAY 08/22/13   [name], MD   Review of Systems  All other systems reviewed and are negative.      Objective:   Physical Exam Nursing note  and vitals reviewed. Constitutional: Pt is oriented to person, place, and time. Pt appears well-developed and well-nourished. No distress.  HENT: Right TM nl. Left TM nl. Oropharynx clear and moist, no exudate.  Head: Normocephalic and atraumatic.  Eyes: EOM are normal. Pupils are equal, round, and reactive to light.  Neck: Neck supple. No thyromegaly. No cervical adenopathy.  Cardiovascular: Normal rate, regular rhythm and normal heart sounds.  Exam reveals no gallop and no friction rub. No murmur heard. Pulmonary/Chest: Effort normal and breath sounds normal. No respiratory distress. Pt has no wheezes. Pt has no rales.  Abdominal: Soft. Bowel sounds are normal. There is no tenderness. There is no rebound and no guarding. No CVA tenderness. No hepatosplenomegaly.  Musculoskeletal: Normal range of motion. No tenderness. No edema.  Neurological: Pt is alert and oriented to person, place, and time.  Skin: Skin is warm and dry.  Psychiatric: Pt has a normal mood and affect. Pt's behavior is normal.   BP 120/76  Pulse 76  Temp(Src) 98.1 F (36.7 C)  Resp 16  Ht 6' (1.829 m)  Wt 251 lb (113.853 kg)  BMI 34.03 kg/m2  SpO2 97% Results for orders placed in visit  on 12/12/13  GLUCOSE, POCT (MANUAL RESULT ENTRY)      Result Value Range   POC Glucose 119 (*) 70 - 99 mg/dl  POCT GLYCOSYLATED HEMOGLOBIN (HGB A1C)      Result Value Range   Hemoglobin A1C 6.3        Assessment & Plan:  Patient is doing well. He still working on his retirement issues. He has colonoscopy with good checkup. Routine labs were done today.  A1c increased from 6-6.3. He will work hard on diet. Recheck 3 months we'll add metformin if still increasing.    I personally performed the services described in this documentation, which was scribed in my presence. The recorded information has been reviewed and is accurate.

## 2014-04-17 ENCOUNTER — Ambulatory Visit (INDEPENDENT_AMBULATORY_CARE_PROVIDER_SITE_OTHER): Payer: BC Managed Care – PPO | Admitting: Emergency Medicine

## 2014-04-17 ENCOUNTER — Encounter: Payer: Self-pay | Admitting: Emergency Medicine

## 2014-04-17 VITALS — BP 108/71 | HR 78 | Temp 97.2°F | Resp 16 | Ht 72.0 in | Wt 248.0 lb

## 2014-04-17 DIAGNOSIS — R739 Hyperglycemia, unspecified: Secondary | ICD-10-CM

## 2014-04-17 DIAGNOSIS — I10 Essential (primary) hypertension: Secondary | ICD-10-CM

## 2014-04-17 DIAGNOSIS — R7309 Other abnormal glucose: Secondary | ICD-10-CM

## 2014-04-17 LAB — POCT GLYCOSYLATED HEMOGLOBIN (HGB A1C): Hemoglobin A1C: 6.2

## 2014-04-17 LAB — LIPID PANEL
CHOLESTEROL: 132 mg/dL (ref 0–200)
HDL: 38 mg/dL — ABNORMAL LOW (ref >39)
LDL Cholesterol: 69 mg/dL (ref 0–99)
Total CHOL/HDL Ratio: 3.5 Ratio
Triglycerides: 127 mg/dL (ref <150)
VLDL: 25 mg/dL (ref 0–40)

## 2014-04-17 LAB — GLUCOSE, POCT (MANUAL RESULT ENTRY): POC Glucose: 110 mg/dl — AB (ref 70–99)

## 2014-04-17 NOTE — Progress Notes (Signed)
Subjective:    Patient ID: Daniel Ramirez, male    DOB: May 05, 1951, 63 y.o.   MRN: [id number] This chart was scribed for Daniel Russian, MD by Anastasia Pall, ED Scribe. This patient was seen in room  and the patient's care was started at 9:44 AM.  Chief Complaint  Patient presents with  . Follow-up    cholesterol   HPI Daniel Ramirez is a 63 y.o. male Pt is on BP and cholesterol medication.   Pt presents for a cholesterol follow up. He reports he has been working outside a lot recently, but has been drinking a large amount of fluids. He has been keeping an eye on his sugar levels. He denies any symptoms. He states he needs record of his colonoscopy due to the anesthesiologist being unwilling to cooperate.    Patient Active Problem List   Diagnosis Date Noted  . PFO (patent foramen ovale) 08/02/2012  . Hypertension 04/12/2012  . Hyperlipidemia 04/12/2012  . Hyperglycemia 04/12/2012  . RBBB 04/12/2012  . Arthritis 04/12/2012   Past Medical History  Diagnosis Date  . Allergy   . Arthritis   . Cataract   . Cardiac arrhythmia   . Gallstones   . HTN (hypertension)   . Pneumonia    Past Surgical History  Procedure Laterality Date  . Cholecystectomy  2011  . Retinal detachment surgery Right     x 2  . Cataract extraction Right   . Yag laser application Right   . Knee cartilage surgery Right     x 2  . Knee arthroscopy Right   . Tonsillectomy     No Known Allergies Prior to Admission medications   Medication Sig Start Date End Date Taking? Authorizing Provider  aspirin 81 MG tablet Take 81 mg by mouth daily.    Historical Provider, MD  cetirizine (ZYRTEC) 10 MG tablet Take 10 mg by mouth daily.    Historical Provider, MD  Multiple Vitamin (MULTIVITAMIN) tablet Take 1 tablet by mouth daily.    Historical Provider, MD  olmesartan (BENICAR) 20 MG tablet Take 0.5 tablets (10 mg total) by mouth daily. 08/22/13   Daniel Russian, MD  pravastatin (PRAVACHOL) 40 MG tablet TAKE 1 TABLET  BY MOUTH EVERY DAY 08/22/13   Daniel Russian, MD   Review of Systems  Constitutional: Negative for fever, chills and diaphoresis.  Respiratory: Negative for cough.   Cardiovascular: Negative for chest pain.  Gastrointestinal: Negative for abdominal pain.  Musculoskeletal: Negative for arthralgias, joint swelling and myalgias.  Skin: Negative for rash.  Psychiatric/Behavioral: Negative for dysphoric mood.      Objective:   Physical Exam  BP 108/71  Pulse 78  Temp(Src) 97.2 F (36.2 C)  Resp 16  Ht 6' (1.829 m)  Wt 248 lb (112.492 kg)  BMI 33.63 kg/m2  SpO2 99%  Nursing note and vitals reviewed. Constitutional: He is oriented to person, place, and time. He appears well-developed and well-nourished. No distress.  HENT:  Head: Normocephalic and atraumatic.  Right Ear: Tympanic membrane, external ear and ear canal normal.  Left Ear: Tympanic membrane, external ear and ear canal normal.  Nose: Nose normal.  Mouth/Throat: Uvula is midline, oropharynx is clear and moist and mucous membranes are normal.  Eyes: Conjunctivae and EOM are normal. Pupils are equal, round, and reactive to light.  Neck: Normal range of motion. Neck supple. No thyromegaly present.  Cardiovascular: Normal rate, regular rhythm and normal heart sounds.   No murmur  heard. Pulmonary/Chest: Effort normal and breath sounds normal. No respiratory distress. He exhibits no tenderness.  Musculoskeletal: Normal range of motion. He exhibits no edema and no tenderness.  Lymphadenopathy:    He has no cervical adenopathy.  Neurological: He is alert and oriented to person, place, and time.  Skin: Skin is warm and dry.  Psychiatric: He has a normal mood and affect. His behavior is normal.  Results for orders placed in visit on 04/17/14  GLUCOSE, POCT (MANUAL RESULT ENTRY)      Result Value Ref Range   POC Glucose 110 (*) 70 - 99 mg/dl  POCT GLYCOSYLATED HEMOGLOBIN (HGB A1C)      Result Value Ref Range   Hemoglobin A1C  6.2        Assessment & Plan:  Hemoglobin A1c 6.2 at goal blood pressure is excellent no change in medication do his yearly physical in the fall I personally performed the services described in this documentation, which was scribed in my presence. The recorded information has been reviewed and is accurate.

## 2014-08-28 ENCOUNTER — Encounter: Payer: Self-pay | Admitting: Emergency Medicine

## 2014-08-28 ENCOUNTER — Ambulatory Visit (INDEPENDENT_AMBULATORY_CARE_PROVIDER_SITE_OTHER): Payer: BC Managed Care – PPO | Admitting: Emergency Medicine

## 2014-08-28 VITALS — BP 120/80 | HR 87 | Temp 98.1°F | Resp 16 | Ht 71.75 in | Wt 248.8 lb

## 2014-08-28 DIAGNOSIS — Z13220 Encounter for screening for lipoid disorders: Secondary | ICD-10-CM

## 2014-08-28 DIAGNOSIS — E785 Hyperlipidemia, unspecified: Secondary | ICD-10-CM

## 2014-08-28 DIAGNOSIS — I10 Essential (primary) hypertension: Secondary | ICD-10-CM

## 2014-08-28 DIAGNOSIS — Z23 Encounter for immunization: Secondary | ICD-10-CM

## 2014-08-28 DIAGNOSIS — Z125 Encounter for screening for malignant neoplasm of prostate: Secondary | ICD-10-CM

## 2014-08-28 DIAGNOSIS — E669 Obesity, unspecified: Secondary | ICD-10-CM

## 2014-08-28 DIAGNOSIS — Z6831 Body mass index (BMI) 31.0-31.9, adult: Secondary | ICD-10-CM | POA: Insufficient documentation

## 2014-08-28 DIAGNOSIS — Z1159 Encounter for screening for other viral diseases: Secondary | ICD-10-CM

## 2014-08-28 DIAGNOSIS — R739 Hyperglycemia, unspecified: Secondary | ICD-10-CM

## 2014-08-28 DIAGNOSIS — Z0000 Encounter for general adult medical examination without abnormal findings: Secondary | ICD-10-CM

## 2014-08-28 DIAGNOSIS — Z1329 Encounter for screening for other suspected endocrine disorder: Secondary | ICD-10-CM

## 2014-08-28 LAB — CBC WITH DIFFERENTIAL/PLATELET
Basophils Absolute: 0 10*3/uL (ref 0.0–0.1)
Basophils Relative: 0 % (ref 0–1)
EOS ABS: 0.2 10*3/uL (ref 0.0–0.7)
Eosinophils Relative: 2 % (ref 0–5)
HCT: 41 % (ref 39.0–52.0)
Hemoglobin: 14.2 g/dL (ref 13.0–17.0)
LYMPHS ABS: 2.3 10*3/uL (ref 0.7–4.0)
LYMPHS PCT: 27 % (ref 12–46)
MCH: 31.6 pg (ref 26.0–34.0)
MCHC: 34.6 g/dL (ref 30.0–36.0)
MCV: 91.1 fL (ref 78.0–100.0)
Monocytes Absolute: 0.7 10*3/uL (ref 0.1–1.0)
Monocytes Relative: 8 % (ref 3–12)
NEUTROS ABS: 5.3 10*3/uL (ref 1.7–7.7)
NEUTROS PCT: 63 % (ref 43–77)
PLATELETS: 283 10*3/uL (ref 150–400)
RBC: 4.5 MIL/uL (ref 4.22–5.81)
RDW: 13.1 % (ref 11.5–15.5)
WBC: 8.4 10*3/uL (ref 4.0–10.5)

## 2014-08-28 LAB — TSH: TSH: 1.65 u[IU]/mL (ref 0.350–4.500)

## 2014-08-28 LAB — LIPID PANEL
CHOLESTEROL: 140 mg/dL (ref 0–200)
HDL: 37 mg/dL — ABNORMAL LOW (ref >39)
LDL Cholesterol: 78 mg/dL (ref 0–99)
TRIGLYCERIDES: 123 mg/dL (ref <150)
Total CHOL/HDL Ratio: 3.8 Ratio
VLDL: 25 mg/dL (ref 0–40)

## 2014-08-28 LAB — HEMOGLOBIN A1C
HEMOGLOBIN A1C: 6.6 % — AB (ref <5.7)
Mean Plasma Glucose: 143 mg/dL — ABNORMAL HIGH (ref <117)

## 2014-08-28 LAB — BASIC METABOLIC PANEL
BUN: 14 mg/dL (ref 6–23)
CHLORIDE: 103 meq/L (ref 96–112)
CO2: 26 mEq/L (ref 19–32)
Calcium: 9.5 mg/dL (ref 8.4–10.5)
Creat: 1.23 mg/dL (ref 0.50–1.35)
Glucose, Bld: 113 mg/dL — ABNORMAL HIGH (ref 70–99)
POTASSIUM: 4.4 meq/L (ref 3.5–5.3)
SODIUM: 139 meq/L (ref 135–145)

## 2014-08-28 LAB — HEPATITIS C ANTIBODY: HCV Ab: NEGATIVE

## 2014-08-28 LAB — HEPATITIS B SURFACE ANTIGEN: Hepatitis B Surface Ag: NEGATIVE

## 2014-08-28 LAB — HEPATITIS B SURFACE ANTIBODY, QUANTITATIVE: HEPATITIS B-POST: 0 m[IU]/mL
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
## 2014-08-28 NOTE — Patient Instructions (Signed)

## 2014-08-29 LAB — PSA: PSA: 0.83 ng/mL (ref <=4.00)

## 2014-08-30 ENCOUNTER — Encounter: Payer: Self-pay | Admitting: Physician Assistant

## 2014-10-22 ENCOUNTER — Other Ambulatory Visit: Payer: Self-pay | Admitting: Emergency Medicine

## 2014-11-19 ENCOUNTER — Other Ambulatory Visit: Payer: Self-pay | Admitting: Emergency Medicine

## 2015-01-01 ENCOUNTER — Ambulatory Visit (INDEPENDENT_AMBULATORY_CARE_PROVIDER_SITE_OTHER): Payer: BLUE CROSS/BLUE SHIELD | Admitting: Emergency Medicine

## 2015-01-01 ENCOUNTER — Encounter: Payer: Self-pay | Admitting: Emergency Medicine

## 2015-01-01 VITALS — BP 109/68 | HR 69 | Temp 97.9°F | Resp 16 | Ht 73.0 in | Wt 250.0 lb

## 2015-01-01 DIAGNOSIS — E7800 Pure hypercholesterolemia, unspecified: Secondary | ICD-10-CM

## 2015-01-01 DIAGNOSIS — I10 Essential (primary) hypertension: Secondary | ICD-10-CM

## 2015-01-01 DIAGNOSIS — R739 Hyperglycemia, unspecified: Secondary | ICD-10-CM

## 2015-01-01 LAB — LIPID PANEL
Cholesterol: 126 mg/dL (ref 0–200)
HDL: 38 mg/dL — ABNORMAL LOW (ref >=40)
LDL Cholesterol: 68 mg/dL (ref 0–99)
TRIGLYCERIDES: 98 mg/dL (ref <150)
Total CHOL/HDL Ratio: 3.3 Ratio
VLDL: 20 mg/dL (ref 0–40)

## 2015-01-01 LAB — BASIC METABOLIC PANEL WITH GFR
BUN: 16 mg/dL (ref 6–23)
CO2: 24 mEq/L (ref 19–32)
CREATININE: 1.16 mg/dL (ref 0.50–1.35)
Calcium: 9.4 mg/dL (ref 8.4–10.5)
Chloride: 105 mEq/L (ref 96–112)
GFR, Est African American: 77 mL/min
GFR, Est Non African American: 66 mL/min
Glucose, Bld: 113 mg/dL — ABNORMAL HIGH (ref 70–99)
POTASSIUM: 4.5 meq/L (ref 3.5–5.3)
Sodium: 142 mEq/L (ref 135–145)

## 2015-01-01 LAB — POCT GLYCOSYLATED HEMOGLOBIN (HGB A1C): Hemoglobin A1C: 6.6

## 2015-01-01 LAB — GLUCOSE, POCT (MANUAL RESULT ENTRY): POC Glucose: 130 mg/dl — AB (ref 70–99)

## 2015-01-01 MED ORDER — METFORMIN HCL 500 MG PO TABS: 500.0000 mg | ORAL_TABLET | Freq: Two times a day (BID) | ORAL | Status: DC

## 2015-01-01 NOTE — Progress Notes (Addendum)
   Subjective:    Patient ID: Daniel Ramirez, male    DOB: 1951-05-02, 64 y.o.   MRN: [id number] This chart was scribed for Daniel Queen, MD by Zola Button, Medical Scribe. This patient was seen in room 21 and the patient's care was started at 8:13 AM.   HPI HPI Comments: AMEAR STROJNY is a 64 y.o. male with a hx of HTN, hyperglycemia, and HLDwho presents to the [facility] for a follow-up for blood sugar. He states his knees do give him problems occasionally, but he states they have been doing fine overall. Patient denies chest pain, SOB, and bowel problems. He does see an eye doctor yearly.   Patient will go fishing on 3/20.  Review of Systems  Respiratory: Negative for shortness of breath.   Cardiovascular: Negative for chest pain.  Gastrointestinal: Negative.        Objective:   Physical Exam CONSTITUTIONAL: Well developed/well nourished HEAD: Normocephalic/atraumatic EYES: EOM/PERRL ENMT: Mucous membranes moist NECK: supple no meningeal signs SPINE: entire spine nontender CV: S1/S2 noted, no murmurs/rubs/gallops noted LUNGS: Lungs are clear to auscultation bilaterally, no apparent distress ABDOMEN: soft, nontender, no rebound or guarding GU: no cva tenderness NEURO: Pt is awake/alert, moves all extremitiesx4 EXTREMITIES: pulses normal, full ROM SKIN: warm, color normal PSYCH: no abnormalities of mood noted  Results for orders placed or performed in visit on 01/01/15  POCT glucose (manual entry)  Result Value Ref Range   POC Glucose 130 (A) 70 - 99 mg/dl  POCT glycosylated hemoglobin (Hb A1C)  Result Value Ref Range   Hemoglobin A1C 6.6         Assessment & Plan:    Hemoglobin A1c has gone from 6.2-6.6. Will start the patient on metformin XL 500 one a day recheck 3-4 months. Blood pressure is perfect. He  looks great today.I personally performed the services described in this documentation, which was scribed in my presence. The recorded information  has been reviewed and is accurate.

## 2015-01-01 NOTE — Patient Instructions (Signed)

## 2015-02-16 ENCOUNTER — Other Ambulatory Visit: Payer: Self-pay | Admitting: Physician Assistant

## 2015-03-12 LAB — HM DIABETES EYE EXAM

## 2015-03-13 ENCOUNTER — Other Ambulatory Visit: Payer: Self-pay

## 2015-03-13 DIAGNOSIS — R739 Hyperglycemia, unspecified: Secondary | ICD-10-CM

## 2015-03-13 MED ORDER — METFORMIN HCL 500 MG PO TABS: 500.0000 mg | ORAL_TABLET | Freq: Two times a day (BID) | ORAL | Status: DC

## 2015-04-11 ENCOUNTER — Encounter: Payer: Self-pay | Admitting: Emergency Medicine

## 2015-04-14 ENCOUNTER — Other Ambulatory Visit: Payer: Self-pay | Admitting: Emergency Medicine

## 2015-05-07 ENCOUNTER — Encounter: Payer: Self-pay | Admitting: Emergency Medicine

## 2015-05-07 ENCOUNTER — Ambulatory Visit (INDEPENDENT_AMBULATORY_CARE_PROVIDER_SITE_OTHER): Payer: BLUE CROSS/BLUE SHIELD | Admitting: Emergency Medicine

## 2015-05-07 VITALS — BP 126/79 | HR 80 | Temp 98.0°F | Resp 18 | Ht 71.2 in | Wt 239.8 lb

## 2015-05-07 DIAGNOSIS — E119 Type 2 diabetes mellitus without complications: Secondary | ICD-10-CM | POA: Diagnosis not present

## 2015-05-07 DIAGNOSIS — I10 Essential (primary) hypertension: Secondary | ICD-10-CM | POA: Diagnosis not present

## 2015-05-07 DIAGNOSIS — E785 Hyperlipidemia, unspecified: Secondary | ICD-10-CM

## 2015-05-07 LAB — LIPID PANEL
CHOL/HDL RATIO: 3.2 ratio
Cholesterol: 132 mg/dL (ref 0–200)
HDL: 41 mg/dL (ref >=40)
LDL Cholesterol: 69 mg/dL (ref 0–99)
Triglycerides: 109 mg/dL (ref <150)
VLDL: 22 mg/dL (ref 0–40)

## 2015-05-07 LAB — BASIC METABOLIC PANEL WITH GFR
BUN: 18 mg/dL (ref 6–23)
CHLORIDE: 104 meq/L (ref 96–112)
CO2: 28 meq/L (ref 19–32)
Calcium: 9.8 mg/dL (ref 8.4–10.5)
Creat: 1.25 mg/dL (ref 0.50–1.35)
GFR, EST NON AFRICAN AMERICAN: 60 mL/min
GFR, Est African American: 70 mL/min
Glucose, Bld: 110 mg/dL — ABNORMAL HIGH (ref 70–99)
POTASSIUM: 4.4 meq/L (ref 3.5–5.3)
Sodium: 141 mEq/L (ref 135–145)

## 2015-05-07 LAB — POCT GLYCOSYLATED HEMOGLOBIN (HGB A1C): HEMOGLOBIN A1C: 6.1

## 2015-05-07 LAB — GLUCOSE, POCT (MANUAL RESULT ENTRY): POC Glucose: 107 mg/dl — AB (ref 70–99)

## 2015-05-07 NOTE — Progress Notes (Addendum)
   Subjective:  This chart was scribed for Daniel Jordan, MD by [facility], medical scribe at [facility].The patient was seen in exam room 22 and the patient's care was started at 9:12 AM.   Patient ID: Daniel Ramirez, male    DOB: [birth year], 64 y.o.   MRN: [id number] Chief Complaint  Patient presents with  . Follow-up  . Diabetes   HPI HPI Comments: Daniel Ramirez is a 64 y.o. male with a history of  Hyperglycemia who presents to [facility] for a four month follow up for diabetes. Last A1C was 6.6 at 01/01/2015 visit. He noticed he has been urinating more frequently, he thinks this is due the medication. He is taking his metformin, but it has been hard on his stomach. He denies chest pain, shortness of breath, and abdominal pain. No numbness in his feet.  Review of Systems  Respiratory: Negative for shortness of breath.   Cardiovascular: Negative for chest pain.  Gastrointestinal: Negative for abdominal pain.  Genitourinary: Positive for frequency.  Neurological: Negative for numbness.     Objective:  BP 126/79 mmHg  Pulse 80  Temp(Src) 98 F (36.7 C) (Oral)  Resp 18  Ht 5' 11.2" (1.808 m)  Wt 239 lb 12.8 oz (108.773 kg)  BMI 33.28 kg/m2 Physical Exam  Vitals reviewed. CONSTITUTIONAL: Well developed/well nourished HEAD: Normocephalic/atraumatic EYES: EOMI/PERRL ENMT: Mucous membranes moist NECK: supple no meningeal signs SPINE/BACK:entire spine nontender CV: S1/S2 noted, no murmurs/rubs/gallops noted LUNGS: Lungs are clear to auscultation bilaterally, no apparent distress ABDOMEN: soft, nontender, no rebound or guarding, bowel sounds noted throughout abdomen GU:no cva tenderness NEURO: Pt is awake/alert/appropriate, moves all extremitiesx4.  No facial droop. Negative microfilament test.   EXTREMITIES: pulses normal/equal, full ROM SKIN: warm, color normal PSYCH: no abnormalities of mood noted, alert and oriented to situation. Results  for orders placed or performed in visit on 05/07/15  POCT glucose (manual entry)  Result Value Ref Range   POC Glucose 107 (A) 70 - 99 mg/dl  POCT glycosylated hemoglobin (Hb A1C)  Result Value Ref Range   Hemoglobin A1C 6.1       Assessment & Plan:   1. Type 2 diabetes mellitus without complication Patient continues to lose weight. His hemoglobin A1c is down to 6.1. No change in medications at present. - POCT glucose (manual entry) - POCT glycosylated hemoglobin (Hb A1C)  2. Essential hypertension Blood pressure at goal - BASIC METABOLIC PANEL WITH GFR  3. Hyperlipidemia No change in medication - Lipid panel   I personally performed the services described in this documentation, which was scribed in my presence. The recorded information has been reviewed and is accurate.  [name], MD  [facility]  05/07/2015 12:36 PM

## 2015-08-06 ENCOUNTER — Encounter: Payer: Self-pay | Admitting: Emergency Medicine

## 2015-08-10 ENCOUNTER — Other Ambulatory Visit: Payer: Self-pay | Admitting: Emergency Medicine

## 2015-09-10 ENCOUNTER — Ambulatory Visit: Payer: BLUE CROSS/BLUE SHIELD | Admitting: Emergency Medicine

## 2015-09-19 ENCOUNTER — Encounter: Payer: Self-pay | Admitting: Emergency Medicine

## 2015-09-19 ENCOUNTER — Ambulatory Visit (INDEPENDENT_AMBULATORY_CARE_PROVIDER_SITE_OTHER): Payer: Medicare Other | Admitting: Emergency Medicine

## 2015-09-19 VITALS — BP 115/72 | HR 76 | Temp 98.1°F | Resp 16 | Ht 71.0 in | Wt 238.0 lb

## 2015-09-19 DIAGNOSIS — I10 Essential (primary) hypertension: Secondary | ICD-10-CM

## 2015-09-19 DIAGNOSIS — E119 Type 2 diabetes mellitus without complications: Secondary | ICD-10-CM

## 2015-09-19 DIAGNOSIS — E785 Hyperlipidemia, unspecified: Secondary | ICD-10-CM | POA: Diagnosis not present

## 2015-09-19 DIAGNOSIS — R739 Hyperglycemia, unspecified: Secondary | ICD-10-CM

## 2015-09-19 DIAGNOSIS — Z23 Encounter for immunization: Secondary | ICD-10-CM

## 2015-09-19 LAB — LIPID PANEL
CHOLESTEROL: 168 mg/dL (ref 125–200)
HDL: 37 mg/dL — AB (ref >=40)
LDL Cholesterol: 107 mg/dL (ref <130)
TRIGLYCERIDES: 120 mg/dL (ref <150)
Total CHOL/HDL Ratio: 4.5 Ratio (ref <=5.0)
VLDL: 24 mg/dL (ref <30)

## 2015-09-19 LAB — BASIC METABOLIC PANEL WITH GFR
BUN: 17 mg/dL (ref 7–25)
CALCIUM: 9.7 mg/dL (ref 8.6–10.3)
CHLORIDE: 103 mmol/L (ref 98–110)
CO2: 24 mmol/L (ref 20–31)
CREATININE: 1.07 mg/dL (ref 0.70–1.25)
GFR, EST NON AFRICAN AMERICAN: 72 mL/min (ref >=60)
GFR, Est African American: 84 mL/min (ref >=60)
Glucose, Bld: 98 mg/dL (ref 65–99)
Potassium: 4.4 mmol/L (ref 3.5–5.3)
SODIUM: 138 mmol/L (ref 135–146)

## 2015-09-19 LAB — MICROALBUMIN, URINE: Microalb, Ur: 0.5 mg/dL

## 2015-09-19 LAB — POCT GLYCOSYLATED HEMOGLOBIN (HGB A1C): HEMOGLOBIN A1C: 6

## 2015-09-19 LAB — GLUCOSE, POCT (MANUAL RESULT ENTRY): POC Glucose: 106 mg/dl — AB (ref 70–99)

## 2015-09-19 LAB — HEMOGLOBIN A1C: HEMOGLOBIN A1C: 6 % (ref 4.0–6.0)

## 2015-09-19 MED ORDER — PRAVASTATIN SODIUM 40 MG PO TABS: ORAL_TABLET | ORAL | Status: DC

## 2015-09-19 MED ORDER — METFORMIN HCL 500 MG PO TABS: 500.0000 mg | ORAL_TABLET | Freq: Two times a day (BID) | ORAL | Status: DC

## 2015-09-19 MED ORDER — OLMESARTAN MEDOXOMIL 20 MG PO TABS: ORAL_TABLET | ORAL | Status: DC

## 2015-09-19 NOTE — Progress Notes (Addendum)
Patient ID: Daniel Ramirez, male   DOB: May 22, 1951, 64 y.o.   MRN: [id number]     This chart was scribed for [name], MD by Zola Button, Medical Scribe. This patient was seen in room 25 and the patient's care was started at 10:28 AM.   Chief Complaint:  Chief Complaint  Patient presents with  . Follow-up  . Hypertension    HPI: Daniel Ramirez is a 64 y.o. male with a history of hypertension, diabetes, and hyperlipidemia who reports to [facility] today for a follow-up for hypertension. Patient has been doing well overall. He has not been taking his pravastatin. Patient declines flu shot. He states he had a pneumonia shot a few years ago.  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 Jackelyn Poling has been doing well. He saw his grandchildren at the beach this past weekend.   Past Medical History  Diagnosis Date  . Allergy   . Arthritis   . Cataract   . Cardiac arrhythmia   . Gallstones   . HTN (hypertension)   . Pneumonia   . Cataract, left eye   . Detached retina   . Diverticulosis   . Internal hemorrhoid    Past Surgical History  Procedure Laterality Date  . Cholecystectomy  2011  . Retinal detachment surgery Right     x 2  . Cataract extraction Right   . Yag laser application Right   . Knee cartilage surgery Right     x 2  . Knee arthroscopy Right   . Tonsillectomy     Social History   Social History  . Marital Status: Single    Spouse Name: Neoma Laming  . Number of Children: 2  . Years of Education: N/A   Occupational History  . Vending/self employed    Social History Main Topics  . Smoking status: Former Smoker -- 1.00 packs/day for .8 years    Types: Cigarettes    Quit date: 01/31/1969  . Smokeless tobacco: Never Used  . Alcohol Use: No  . Drug Use: No  . Sexual Activity:    Partners: Female    Museum/gallery curator: None   Other Topics Concern  . None   Social History Narrative   Married. Education: [employer].   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  His daughter is going through a separation/divorce with her husband.  He has been doing physical work at her house at present (08/28/2014), and feels it is therapeutic for him.     Family History  Problem Relation Age of Onset  . Heart disease Father     MI  . Rheum arthritis Mother    No Known Allergies Prior to Admission medications   Medication Sig Start Date End Date Taking? Authorizing Provider  aspirin 81 MG tablet Take 81 mg by mouth daily.   Yes Historical Provider, MD  BENICAR 20 MG tablet TAKE 1/2 (ONE-HALF) TABLET BY MOUTH DAILY. 04/15/15  Yes [name], MD  cetirizine (ZYRTEC) 10 MG tablet Take 10 mg by mouth daily.   Yes Historical Provider, MD  metFORMIN (GLUCOPHAGE) 500 MG tablet Take 1 tablet (500 mg total) by mouth 2 (two) times daily with a meal. 03/13/15  Yes [name], MD  Multiple Vitamin (MULTIVITAMIN) tablet Take 1 tablet by mouth daily.   Yes Historical Provider, MD  pravastatin (PRAVACHOL) 40 MG tablet TAKE 1 TABLET BY MOUTH EVERY DAY. 08/10/15  Yes Ezekiel Slocumb, PA-C  ROS: The patient denies fevers, chills, night sweats, unintentional weight loss, chest pain, palpitations, wheezing, dyspnea on exertion, nausea, vomiting, abdominal pain, dysuria, hematuria, melena, numbness, weakness, or tingling.   All other systems have been reviewed and were otherwise negative with the exception of those mentioned in the HPI and as above.    PHYSICAL EXAM: Filed Vitals:   09/19/15 1002  BP: 115/72  Pulse: 76  Temp: 98.1 F (36.7 C)  Resp: 16   Body mass index is 33.21 kg/(m^2).   General: Alert, no acute distress HEENT:  Normocephalic, atraumatic, oropharynx patent. Eye: Juliette M[facility] Cardiovascular:  Regular rate and rhythm, no rubs murmurs or gallops.  No Carotid bruits, radial pulse intact. No pedal edema.  Respiratory: Clear to auscultation bilaterally.  No wheezes, rales, or rhonchi.  No cyanosis, no use of accessory  musculature Abdominal: No organomegaly, abdomen is soft and non-tender, positive bowel sounds.  No masses. Musculoskeletal: Gait intact. No edema, tenderness Skin: No rashes. Neurologic: Facial musculature symmetric. Psychiatric: Patient acts appropriately throughout our interaction. Lymphatic: No cervical or submandibular lymphadenopathy    LABS:  Results for orders placed or performed in visit on 09/19/15  POCT glucose (manual entry)  Result Value Ref Range   POC Glucose 106 (A) 70 - 99 mg/dl  POCT glycosylated hemoglobin (Hb A1C)  Result Value Ref Range   Hemoglobin A1C 6.0     EKG/XRAY:   Primary read interpreted by Dr. Everlene Farrier at [facility].   ASSESSMENT/PLAN:  Patient doing great he looks great Prevnar given today A1c at goal at 6.0 I told him to continue his pravastatin.I personally performed the services described in this documentation, which was scribed in my presence. The recorded information has been reviewed and is accurate. By signing my name below, I, Zola Button, attest that this documentation has been prepared under the direction and in the presence of [name], MD.  Electronically Signed: Zola Button, Medical Scribe. 09/19/2015. 10:28 AM.  I personally performed the services described in this documentation, which was scribed in my presence. The recorded information has been reviewed and is accurate. Gross sideeffects, risk and benefits, and alternatives of medications d/w patient. Patient is aware that all medications have potential sideeffects and we are unable to predict every sideeffect or drug-drug interaction that may occur.  [name] MD 09/19/2015 10:28 AM

## 2015-09-30 ENCOUNTER — Other Ambulatory Visit: Payer: Self-pay

## 2015-09-30 DIAGNOSIS — R739 Hyperglycemia, unspecified: Secondary | ICD-10-CM

## 2015-09-30 MED ORDER — OLMESARTAN MEDOXOMIL 20 MG PO TABS: ORAL_TABLET | ORAL | Status: DC

## 2015-09-30 MED ORDER — METFORMIN HCL 500 MG PO TABS: 500.0000 mg | ORAL_TABLET | Freq: Two times a day (BID) | ORAL | Status: DC

## 2015-10-03 ENCOUNTER — Encounter: Payer: Self-pay | Admitting: Family Medicine

## 2015-12-12 DIAGNOSIS — L570 Actinic keratosis: Secondary | ICD-10-CM | POA: Diagnosis not present

## 2015-12-12 DIAGNOSIS — D1801 Hemangioma of skin and subcutaneous tissue: Secondary | ICD-10-CM | POA: Diagnosis not present

## 2015-12-12 DIAGNOSIS — L821 Other seborrheic keratosis: Secondary | ICD-10-CM | POA: Diagnosis not present

## 2015-12-12 DIAGNOSIS — Z85828 Personal history of other malignant neoplasm of skin: Secondary | ICD-10-CM | POA: Diagnosis not present

## 2016-01-16 ENCOUNTER — Ambulatory Visit: Payer: Medicare Other | Admitting: Physician Assistant

## 2016-01-22 ENCOUNTER — Ambulatory Visit: Payer: Medicare Other | Admitting: Physician Assistant

## 2016-01-29 ENCOUNTER — Ambulatory Visit (INDEPENDENT_AMBULATORY_CARE_PROVIDER_SITE_OTHER): Payer: Medicare Other | Admitting: Physician Assistant

## 2016-01-29 ENCOUNTER — Encounter: Payer: Self-pay | Admitting: Physician Assistant

## 2016-01-29 VITALS — BP 121/73 | HR 81 | Temp 98.2°F | Resp 16 | Ht 71.75 in | Wt 246.2 lb

## 2016-01-29 DIAGNOSIS — E119 Type 2 diabetes mellitus without complications: Secondary | ICD-10-CM | POA: Diagnosis not present

## 2016-01-29 DIAGNOSIS — I10 Essential (primary) hypertension: Secondary | ICD-10-CM

## 2016-01-29 LAB — COMPLETE METABOLIC PANEL WITH GFR
ALBUMIN: 4.1 g/dL (ref 3.6–5.1)
ALK PHOS: 52 U/L (ref 40–115)
ALT: 21 U/L (ref 9–46)
AST: 21 U/L (ref 10–35)
BUN: 14 mg/dL (ref 7–25)
CALCIUM: 9.4 mg/dL (ref 8.6–10.3)
CO2: 26 mmol/L (ref 20–31)
Chloride: 105 mmol/L (ref 98–110)
Creat: 1.15 mg/dL (ref 0.70–1.25)
GFR, EST AFRICAN AMERICAN: 77 mL/min (ref >=60)
GFR, EST NON AFRICAN AMERICAN: 66 mL/min (ref >=60)
GLUCOSE: 108 mg/dL — AB (ref 65–99)
Potassium: 4.3 mmol/L (ref 3.5–5.3)
Sodium: 142 mmol/L (ref 135–146)
TOTAL PROTEIN: 6.8 g/dL (ref 6.1–8.1)
Total Bilirubin: 0.5 mg/dL (ref 0.2–1.2)

## 2016-01-29 LAB — POCT GLYCOSYLATED HEMOGLOBIN (HGB A1C): Hemoglobin A1C: 6.2

## 2016-01-29 NOTE — Progress Notes (Signed)
01/29/2016 5:33 PM   DOB: 1951-08-27 / MRN: [id number]  SUBJECTIVE:  Daniel Ramirez is a 65 y.o. male presenting for diabetes and HTN follow up.  He reports one episode of nocturia nightly.  He denies sock and glove paresthesia.    He has been to the eye doctor in the last year and has a follow up in three months.  He feels well and has no problems today.   Immunization History  Administered Date(s) Administered  . Pneumococcal Conjugate-13 09/19/2015  . Pneumococcal Polysaccharide-23 05/20/2010  . Tdap 02/07/2008    He has No Known Allergies.   He  has a past medical history of Allergy; Arthritis; Cataract; Cardiac arrhythmia; Gallstones; HTN (hypertension); Pneumonia; Cataract, left eye; Detached retina; Diverticulosis; and Internal hemorrhoid.    He  reports that he quit smoking about 47 years ago. His smoking use included Cigarettes. He has a .8 pack-year smoking history. He has never used smokeless tobacco. He reports that he does not drink alcohol or use illicit drugs. He  reports that he currently engages in sexual activity and has had male partners. He reports using the following method of birth control/protection: None. The patient  has past surgical history that includes Cholecystectomy (2011); Retinal detachment surgery (Right); Cataract extraction (Right); Yag laser application (Right); Knee cartilage surgery (Right); Knee arthroscopy (Right); and Tonsillectomy.  His family history includes Heart disease in his father; Rheum arthritis in his mother.  Review of Systems  Constitutional: Negative for fever and chills.  Eyes: Negative for blurred vision.  Respiratory: Negative for cough and shortness of breath.   Cardiovascular: Negative for chest pain.  Gastrointestinal: Negative for nausea and abdominal pain.  Genitourinary: Negative for dysuria, urgency and frequency.  Musculoskeletal: Negative for myalgias.  Skin: Negative for rash.  Neurological: Negative for dizziness,  tingling and headaches.  Psychiatric/Behavioral: Negative for depression. The patient is not nervous/anxious.     Problem list and medications reviewed and updated by myself where necessary, and exist elsewhere in the encounter.   OBJECTIVE:  BP 121/73 mmHg  Pulse 81  Temp(Src) 98.2 F (36.8 C) (Oral)  Resp 16  Ht 5' 11.75" (1.822 m)  Wt 246 lb 3.2 oz (111.676 kg)  BMI 33.64 kg/m2  Physical Exam  Constitutional: He is oriented to person, place, and time. He appears well-developed and well-nourished.  HENT:  Head: Normocephalic and atraumatic.  Right Ear: Hearing, tympanic membrane, external ear and ear canal normal.  Left Ear: Hearing, tympanic membrane, external ear and ear canal normal.  Nose: Nose normal.  Mouth/Throat: Uvula is midline, oropharynx is clear and moist and mucous membranes are normal.  Eyes: Conjunctivae, EOM and lids are normal. Pupils are equal, round, and reactive to light. Right eye exhibits no discharge. Left eye exhibits no discharge. No scleral icterus.  Neck: Trachea normal and normal range of motion. Neck supple. Carotid bruit is not present.  Cardiovascular: Normal rate, regular rhythm, normal heart sounds, intact distal pulses and normal pulses.   No murmur heard. Pulmonary/Chest: Effort normal and breath sounds normal. No respiratory distress. He has no wheezes. He has no rhonchi.  Abdominal: Soft. Normal appearance and bowel sounds are normal. He exhibits no abdominal bruit.  Musculoskeletal: Normal range of motion. He exhibits no edema or tenderness.  Lymphadenopathy:       Head (right side): No submental, no submandibular, no tonsillar, no preauricular, no posterior auricular and no occipital adenopathy present.       Head (left side): No  submental, no submandibular, no tonsillar, no preauricular, no posterior auricular and no occipital adenopathy present.    He has no cervical adenopathy.  Neurological: He is alert and oriented to person, place,  and time. He has normal strength and normal reflexes. No cranial nerve deficit or sensory deficit. Coordination and gait normal.  Skin: Skin is warm, dry and intact. No lesion and no rash noted.  Psychiatric: He has a normal mood and affect. His speech is normal and behavior is normal. Judgment and thought content normal.    Results for orders placed or performed in visit on 01/29/16 (from the past 72 hour(s))  POCT glycosylated hemoglobin (Hb A1C)     Status: None   Collection Time: 01/29/16  3:09 PM  Result Value Ref Range   Hemoglobin A1C 6.2     No results found.  ASSESSMENT AND PLAN  [name] was seen today for follow-up.  Diagnoses and all orders for this visit:  Type 2 diabetes mellitus without complication, without long-term current use of insulin (HCC) -     POCT glycosylated hemoglobin (Hb A1C)  Essential hypertension -     COMPLETE METABOLIC PANEL WITH GFR   The patient was advised to call or return to clinic if he does not see an improvement in symptoms or to seek the care of the closest emergency department if he worsens with the above plan.   Philis Fendt, MHS, PA-C [facility] 01/29/2016 5:33 PM

## 2016-03-10 DIAGNOSIS — H5213 Myopia, bilateral: Secondary | ICD-10-CM | POA: Diagnosis not present

## 2016-03-10 DIAGNOSIS — H25042 Posterior subcapsular polar age-related cataract, left eye: Secondary | ICD-10-CM | POA: Diagnosis not present

## 2016-03-10 DIAGNOSIS — E119 Type 2 diabetes mellitus without complications: Secondary | ICD-10-CM | POA: Diagnosis not present

## 2016-03-10 LAB — HM DIABETES EYE EXAM

## 2016-05-25 ENCOUNTER — Other Ambulatory Visit: Payer: Self-pay | Admitting: Emergency Medicine

## 2016-05-25 DIAGNOSIS — E785 Hyperlipidemia, unspecified: Secondary | ICD-10-CM

## 2016-07-07 ENCOUNTER — Ambulatory Visit (INDEPENDENT_AMBULATORY_CARE_PROVIDER_SITE_OTHER): Payer: Medicare Other | Admitting: Physician Assistant

## 2016-07-07 ENCOUNTER — Encounter: Payer: Self-pay | Admitting: Physician Assistant

## 2016-07-07 VITALS — BP 128/88 | HR 80 | Temp 98.4°F | Resp 17 | Ht 73.0 in | Wt 244.0 lb

## 2016-07-07 DIAGNOSIS — E785 Hyperlipidemia, unspecified: Secondary | ICD-10-CM | POA: Diagnosis not present

## 2016-07-07 DIAGNOSIS — E119 Type 2 diabetes mellitus without complications: Secondary | ICD-10-CM

## 2016-07-07 DIAGNOSIS — Z114 Encounter for screening for human immunodeficiency virus [HIV]: Secondary | ICD-10-CM

## 2016-07-07 DIAGNOSIS — I10 Essential (primary) hypertension: Secondary | ICD-10-CM | POA: Diagnosis not present

## 2016-07-07 LAB — LIPID PANEL
CHOL/HDL RATIO: 3.4 ratio (ref <=5.0)
Cholesterol: 139 mg/dL (ref 125–200)
HDL: 41 mg/dL (ref >=40)
LDL Cholesterol: 70 mg/dL (ref <130)
TRIGLYCERIDES: 139 mg/dL (ref <150)
VLDL: 28 mg/dL (ref <30)

## 2016-07-07 LAB — COMPREHENSIVE METABOLIC PANEL
ALBUMIN: 4.4 g/dL (ref 3.6–5.1)
ALK PHOS: 52 U/L (ref 40–115)
ALT: 20 U/L (ref 9–46)
AST: 18 U/L (ref 10–35)
BILIRUBIN TOTAL: 0.7 mg/dL (ref 0.2–1.2)
BUN: 17 mg/dL (ref 7–25)
CALCIUM: 9.5 mg/dL (ref 8.6–10.3)
CO2: 23 mmol/L (ref 20–31)
Chloride: 104 mmol/L (ref 98–110)
Creat: 1.16 mg/dL (ref 0.70–1.25)
Glucose, Bld: 108 mg/dL — ABNORMAL HIGH (ref 65–99)
POTASSIUM: 4.4 mmol/L (ref 3.5–5.3)
Sodium: 139 mmol/L (ref 135–146)
Total Protein: 7 g/dL (ref 6.1–8.1)

## 2016-07-07 LAB — HIV ANTIBODY (ROUTINE TESTING W REFLEX): HIV 1&2 Ab, 4th Generation: NONREACTIVE

## 2016-07-07 NOTE — Patient Instructions (Addendum)
Keep up the great work!    IF you received an x-ray today, you will receive an invoice from [facility] Radiology. Please contact [facility] Radiology at [phone] with questions or concerns regarding your invoice.   IF you received labwork today, you will receive an invoice from [employer]. Please contact [facility] at [phone] with questions or concerns regarding your invoice.   Our billing staff will not be able to assist you with questions regarding bills from these companies.  You will be contacted with the lab results as soon as they are available. The fastest way to get your results is to activate your My Chart account. Instructions are located on the last page of this paperwork. If you have not heard from [place] regarding the results in 2 weeks, please contact this office.

## 2016-07-07 NOTE — Progress Notes (Signed)
Patient ID: Daniel Ramirez, male    DOB: 1951/09/14, 65 y.o.   MRN: [id number]  PCP: Jenny Reichmann, MD  Subjective:   Chief Complaint  Patient presents with  . Follow-up    HBP diabetes, cholestrol high     HPI Presents for evaluation of diabetes, HTN and hyperlipidemia.  Does not check glucose at home. A1C usually around 6%. Sees DDS Q6 months. Next visit next week. Eye specialist annually.  No concerns or complaints today. Tolerating medications well without adverse effects..    Review of Systems  Constitutional: Negative for activity change, appetite change, fatigue and unexpected weight change.  HENT: Negative for congestion, dental problem, ear pain, hearing loss, mouth sores, postnasal drip, rhinorrhea, sneezing, sore throat, tinnitus and trouble swallowing.   Eyes: Negative for photophobia, pain, redness and visual disturbance.  Respiratory: Negative for cough, chest tightness and shortness of breath.   Cardiovascular: Negative for chest pain, palpitations and leg swelling.  Gastrointestinal: Negative for abdominal pain, blood in stool, constipation, diarrhea, nausea and vomiting.  Genitourinary: Negative for dysuria, frequency, hematuria and urgency.  Musculoskeletal: Negative for arthralgias, gait problem, myalgias and neck stiffness.  Skin: Negative for rash.  Neurological: Negative for dizziness, speech difficulty, weakness, light-headedness, numbness and headaches.  Hematological: Negative for adenopathy.  Psychiatric/Behavioral: Negative for confusion and sleep disturbance. The patient is not nervous/anxious.        Patient Active Problem List   Diagnosis Date Noted  . Diabetes ([place]) 05/07/2015  . Obesity 08/28/2014  . PFO (patent foramen ovale) 08/02/2012  . Hypertension 04/12/2012  . Hyperlipidemia 04/12/2012  . Hyperglycemia 04/12/2012  . RBBB 04/12/2012  . Arthritis 04/12/2012     Prior to Admission medications   Medication Sig Start Date End  Date Taking? Authorizing Provider  aspirin 81 MG tablet Take 81 mg by mouth daily.   Yes Historical Provider, MD  cetirizine (ZYRTEC) 10 MG tablet Take 10 mg by mouth daily.   Yes Historical Provider, MD  metFORMIN (GLUCOPHAGE) 500 MG tablet Take 1 tablet (500 mg total) by mouth 2 (two) times daily with a meal. 09/30/15  Yes [name], MD  Multiple Vitamin (MULTIVITAMIN) tablet Take 1 tablet by mouth daily.   Yes Historical Provider, MD  olmesartan (BENICAR) 20 MG tablet TAKE 1/2 (ONE-HALF) TABLET BY MOUTH DAILY. 09/30/15  Yes [name], MD  pravastatin (PRAVACHOL) 40 MG tablet TAKE 1 TABLET BY MOUTH EVERY DAY. 09/19/15  Yes [name], MD     No Known Allergies     Objective:  Physical Exam  Constitutional: He is oriented to person, place, and time. He appears well-developed and well-nourished. He is active and cooperative. No distress.  BP 128/88 (BP Location: Right Arm, Patient Position: Sitting, Cuff Size: Normal)   Pulse 80   Temp 98.4 F (36.9 C) (Oral)   Resp 17   Ht 6\' 1"  (1.854 m)   Wt 244 lb (110.7 kg)   SpO2 96%   BMI 32.19 kg/m   HENT:  Head: Normocephalic and atraumatic.  Right Ear: Hearing normal.  Left Ear: Hearing normal.  Eyes: Conjunctivae are normal. No scleral icterus.  Neck: Normal range of motion. Neck supple. No thyromegaly present.  Cardiovascular: Normal rate, regular rhythm and normal heart sounds.   Pulses:      Radial pulses are 2+ on the right side, and 2+ on the left side.  Pulmonary/Chest: Effort normal and breath sounds normal.  Lymphadenopathy:  Head (right side): No tonsillar, no preauricular, no posterior auricular and no occipital adenopathy present.       Head (left side): No tonsillar, no preauricular, no posterior auricular and no occipital adenopathy present.    He has no cervical adenopathy.       Right: No supraclavicular adenopathy present.       Left: No supraclavicular adenopathy present.  Neurological: He is alert  and oriented to person, place, and time. No sensory deficit.  Skin: Skin is warm, dry and intact. No rash noted. No cyanosis or erythema. Nails show no clubbing.  Psychiatric: He has a normal mood and affect. His speech is normal and behavior is normal.       Diabetic Foot Exam - Simple   Simple Foot Form Diabetic Foot exam was performed with the following findings:  Yes 07/07/2016  8:34 AM  Visual Inspection No deformities, no ulcerations, no other skin breakdown bilaterally:  Yes Sensation Testing Intact to touch and monofilament testing bilaterally:  Yes Pulse Check Posterior Tibialis and Dorsalis pulse intact bilaterally:  Yes Comments        Assessment & Plan:   1. Essential hypertension Controlled. Stable. Continue current treatment. - Comprehensive metabolic panel  2. Type 2 diabetes mellitus without complication, without long-term current use of insulin ([place]) Await labs. Adjust regimen as indicated by results. Has been controlled. - Hemoglobin A1c - Comprehensive metabolic panel - HM Diabetes Foot Exam  3. Hyperlipidemia Await labs. Adjust regimen as indicated by results. - Comprehensive metabolic panel - Lipid panel  4. Screening for HIV (human immunodeficiency virus) - HIV antibody   Fara Chute, PA-C Physician Assistant-Certified [facility]

## 2016-07-08 LAB — HEMOGLOBIN A1C
HEMOGLOBIN A1C: 6 % — AB (ref <5.7)
Mean Plasma Glucose: 126 mg/dL

## 2016-08-20 ENCOUNTER — Other Ambulatory Visit: Payer: Self-pay | Admitting: Emergency Medicine

## 2016-08-20 DIAGNOSIS — R739 Hyperglycemia, unspecified: Secondary | ICD-10-CM

## 2016-08-20 DIAGNOSIS — E785 Hyperlipidemia, unspecified: Secondary | ICD-10-CM

## 2016-10-06 ENCOUNTER — Ambulatory Visit (INDEPENDENT_AMBULATORY_CARE_PROVIDER_SITE_OTHER): Payer: Medicare Other | Admitting: Physician Assistant

## 2016-10-06 ENCOUNTER — Encounter: Payer: Self-pay | Admitting: Physician Assistant

## 2016-10-06 VITALS — BP 122/74 | HR 75 | Temp 98.0°F | Resp 18 | Ht 73.0 in | Wt 240.0 lb

## 2016-10-06 DIAGNOSIS — Z23 Encounter for immunization: Secondary | ICD-10-CM | POA: Diagnosis not present

## 2016-10-06 DIAGNOSIS — E785 Hyperlipidemia, unspecified: Secondary | ICD-10-CM

## 2016-10-06 DIAGNOSIS — M1990 Unspecified osteoarthritis, unspecified site: Secondary | ICD-10-CM

## 2016-10-06 DIAGNOSIS — I10 Essential (primary) hypertension: Secondary | ICD-10-CM

## 2016-10-06 DIAGNOSIS — E119 Type 2 diabetes mellitus without complications: Secondary | ICD-10-CM

## 2016-10-06 MED ORDER — PRAVASTATIN SODIUM 40 MG PO TABS: 40.0000 mg | ORAL_TABLET | Freq: Every day | ORAL | 0 refills | Status: DC

## 2016-10-06 MED ORDER — METFORMIN HCL 500 MG PO TABS: 500.0000 mg | ORAL_TABLET | Freq: Two times a day (BID) | ORAL | 3 refills | Status: DC

## 2016-10-06 MED ORDER — OLMESARTAN MEDOXOMIL 20 MG PO TABS: ORAL_TABLET | ORAL | 3 refills | Status: DC

## 2016-10-06 NOTE — Progress Notes (Signed)
Patient ID: Daniel Ramirez, male    DOB: 01-15-1951, 65 y.o.   MRN: [id number]  PCP: Jenny Reichmann, MD  Chief Complaint  Patient presents with  . Follow-up    3 MONTH    Subjective:   Presents for evaluation of diabetes, HTN and hyperlipidemia.  He continues to tolerate his medications and feel well.  DDS next week. Eye exam annually. Dermatology annually.  Diabetes has been  No home BS checks. Increased urination since starting metformin. No other changes.  Hand arthritis continues to bother him, but not enough to want to take anything or see a specialist. He's noted some increased visible changes at the joints of his fingers. States that as long as he can tie fishing ties, he's ok with the deformity and reduced function.  No CP, SOB, HA, dizziness. No GI symptoms.    Review of Systems  Constitutional: Negative for activity change, appetite change, fatigue and unexpected weight change.  HENT: Negative for congestion, dental problem, ear pain, hearing loss, mouth sores, postnasal drip, rhinorrhea, sneezing, sore throat, tinnitus and trouble swallowing.   Eyes: Negative for photophobia, pain, redness and visual disturbance.  Respiratory: Negative for cough, chest tightness and shortness of breath.   Cardiovascular: Negative for chest pain, palpitations and leg swelling.  Gastrointestinal: Negative for abdominal pain, blood in stool, constipation, diarrhea, nausea and vomiting.  Endocrine: Positive for polyuria. Negative for polydipsia and polyphagia.  Genitourinary: Positive for frequency. Negative for dysuria, hematuria and urgency.  Musculoskeletal: Positive for arthralgias (fingers, hands). Negative for gait problem, myalgias and neck stiffness.  Skin: Negative for rash.  Neurological: Negative for dizziness, speech difficulty, weakness, light-headedness, numbness and headaches.  Hematological: Negative for adenopathy.  Psychiatric/Behavioral: Negative for confusion and  sleep disturbance. The patient is not nervous/anxious.        Patient Active Problem List   Diagnosis Date Noted  . Diabetes ([place]) 05/07/2015  . Obesity 08/28/2014  . PFO (patent foramen ovale) 08/02/2012  . Hypertension 04/12/2012  . Hyperlipidemia 04/12/2012  . RBBB 04/12/2012  . Arthritis 04/12/2012     Prior to Admission medications   Medication Sig Start Date End Date Taking? Authorizing Provider  aspirin 81 MG tablet Take 81 mg by mouth daily.   Yes Historical Provider, MD  cetirizine (ZYRTEC) 10 MG tablet Take 10 mg by mouth daily.   Yes Historical Provider, MD  metFORMIN (GLUCOPHAGE) 500 MG tablet TAKE 1 TABLET BY MOUTH TWO  TIMES DAILY WITH MEALS 08/23/16  Yes [name], MD  Multiple Vitamin (MULTIVITAMIN) tablet Take 1 tablet by mouth daily.   Yes Historical Provider, MD  olmesartan (BENICAR) 20 MG tablet TAKE 1/2 (ONE-HALF) TABLET BY MOUTH DAILY. 09/30/15  Yes [name], MD  pravastatin (PRAVACHOL) 40 MG tablet TAKE 1 TABLET BY MOUTH  EVERY DAY 08/23/16  Yes [name], MD     No Known Allergies     Objective:  Physical Exam  Constitutional: He is oriented to person, place, and time. He appears well-developed and well-nourished. He is active and cooperative. No distress.  BP 122/74 (BP Location: Left Arm, Patient Position: Sitting, Cuff Size: Large)   Pulse 75   Temp 98 F (36.7 C) (Oral)   Resp 18   Ht 6\' 1"  (1.854 m)   Wt 240 lb (108.9 kg)   SpO2 97%   BMI 31.66 kg/m   HENT:  Head: Normocephalic and atraumatic.  Right Ear: Hearing normal.  Left Ear: Hearing normal.  Eyes: Conjunctivae are normal. No scleral icterus.  Neck: Normal range of motion. Neck supple. No thyromegaly present.  Cardiovascular: Normal rate, regular rhythm and normal heart sounds.   Pulses:      Radial pulses are 2+ on the right side, and 2+ on the left side.  Pulmonary/Chest: Effort normal and breath sounds normal.  Lymphadenopathy:       Head (right side): No  tonsillar, no preauricular, no posterior auricular and no occipital adenopathy present.       Head (left side): No tonsillar, no preauricular, no posterior auricular and no occipital adenopathy present.    He has no cervical adenopathy.       Right: No supraclavicular adenopathy present.       Left: No supraclavicular adenopathy present.  Neurological: He is alert and oriented to person, place, and time. No sensory deficit.  Skin: Skin is warm, dry and intact. No rash noted. No cyanosis or erythema. Nails show no clubbing.  Psychiatric: He has a normal mood and affect. His speech is normal and behavior is normal.           Assessment & Plan:   1. Type 2 diabetes mellitus without complication, without long-term current use of insulin ([place]) Has been well controlled. Await lab results. - metFORMIN (GLUCOPHAGE) 500 MG tablet; Take 1 tablet (500 mg total) by mouth 2 (two) times daily with a meal.  Dispense: 180 tablet; Refill: 3 - Comprehensive metabolic panel - Hemoglobin A1c - Microalbumin, urine  2. Essential hypertension Controlled. Continue current treatment. - olmesartan (BENICAR) 20 MG tablet; TAKE 1/2 (ONE-HALF) TABLET BY MOUTH DAILY.  Dispense: 90 tablet; Refill: 3 - CBC with Differential/Platelet - Comprehensive metabolic panel  3. Hyperlipidemia, unspecified hyperlipidemia type Await lab results. - pravastatin (PRAVACHOL) 40 MG tablet; Take 1 tablet (40 mg total) by mouth daily.  Dispense: 90 tablet; Refill: 0 - Comprehensive metabolic panel  4. Need for pneumococcal vaccination Series complete. - Pneumococcal polysaccharide vaccine 23-valent greater than or equal to 2yo subcutaneous/IM  5. Arthritis He'll let me know if he desires specialty evaluation    Fara Chute, PA-C Physician Assistant-Certified [facility] [place]

## 2016-10-06 NOTE — Patient Instructions (Addendum)
Keep up the great work!    IF you received an x-ray today, you will receive an invoice from [facility] Radiology. Please contact [facility] Radiology at [phone] with questions or concerns regarding your invoice.   IF you received labwork today, you will receive an invoice from [employer]. Please contact [facility] at [phone] with questions or concerns regarding your invoice.   Our billing staff will not be able to assist you with questions regarding bills from these companies.  You will be contacted with the lab results as soon as they are available. The fastest way to get your results is to activate your My Chart account. Instructions are located on the last page of this paperwork. If you have not heard from [place] regarding the results in 2 weeks, please contact this office.

## 2016-10-07 LAB — COMPREHENSIVE METABOLIC PANEL
A/G RATIO: 1.5 (ref 1.2–2.2)
ALBUMIN: 4.4 g/dL (ref 3.6–4.8)
ALT: 21 IU/L (ref 0–44)
AST: 21 IU/L (ref 0–40)
Alkaline Phosphatase: 65 IU/L (ref 39–117)
BILIRUBIN TOTAL: 0.4 mg/dL (ref 0.0–1.2)
BUN / CREAT RATIO: 13 (ref 10–24)
BUN: 14 mg/dL (ref 8–27)
CALCIUM: 9.7 mg/dL (ref 8.6–10.2)
CO2: 27 mmol/L (ref 18–29)
Chloride: 102 mmol/L (ref 96–106)
Creatinine, Ser: 1.11 mg/dL (ref 0.76–1.27)
GFR, EST AFRICAN AMERICAN: 80 mL/min/{1.73_m2} (ref >59)
GFR, EST NON AFRICAN AMERICAN: 69 mL/min/{1.73_m2} (ref >59)
GLOBULIN, TOTAL: 2.9 g/dL (ref 1.5–4.5)
Glucose: 115 mg/dL — ABNORMAL HIGH (ref 65–99)
POTASSIUM: 4.7 mmol/L (ref 3.5–5.2)
Sodium: 144 mmol/L (ref 134–144)
TOTAL PROTEIN: 7.3 g/dL (ref 6.0–8.5)

## 2016-10-07 LAB — HEMOGLOBIN A1C
Est. average glucose Bld gHb Est-mCnc: 126 mg/dL
Hgb A1c MFr Bld: 6 % — ABNORMAL HIGH (ref 4.8–5.6)

## 2016-10-07 LAB — CBC WITH DIFFERENTIAL/PLATELET
Basophils Absolute: 0 10*3/uL (ref 0.0–0.2)
Basos: 0 %
EOS (ABSOLUTE): 0.2 10*3/uL (ref 0.0–0.4)
Eos: 2 %
HEMATOCRIT: 44.3 % (ref 37.5–51.0)
HEMOGLOBIN: 14.4 g/dL (ref 13.0–17.7)
IMMATURE GRANS (ABS): 0 10*3/uL (ref 0.0–0.1)
IMMATURE GRANULOCYTES: 0 %
LYMPHS ABS: 2.4 10*3/uL (ref 0.7–3.1)
Lymphs: 31 %
MCH: 31.8 pg (ref 26.6–33.0)
MCHC: 32.5 g/dL (ref 31.5–35.7)
MCV: 98 fL — AB (ref 79–97)
MONOCYTES: 7 %
MONOS ABS: 0.6 10*3/uL (ref 0.1–0.9)
Neutrophils Absolute: 4.5 10*3/uL (ref 1.4–7.0)
Neutrophils: 60 %
PLATELETS: 243 10*3/uL (ref 150–379)
RBC: 4.53 x10E6/uL (ref 4.14–5.80)
RDW: 13.3 % (ref 12.3–15.4)
WBC: 7.6 10*3/uL (ref 3.4–10.8)

## 2016-10-07 LAB — MICROALBUMIN, URINE

## 2016-12-10 ENCOUNTER — Telehealth: Payer: Self-pay

## 2016-12-10 ENCOUNTER — Other Ambulatory Visit: Payer: Self-pay | Admitting: Physician Assistant

## 2016-12-10 DIAGNOSIS — D225 Melanocytic nevi of trunk: Secondary | ICD-10-CM | POA: Diagnosis not present

## 2016-12-10 DIAGNOSIS — L739 Follicular disorder, unspecified: Secondary | ICD-10-CM | POA: Diagnosis not present

## 2016-12-10 DIAGNOSIS — L821 Other seborrheic keratosis: Secondary | ICD-10-CM | POA: Diagnosis not present

## 2016-12-10 DIAGNOSIS — L814 Other melanin hyperpigmentation: Secondary | ICD-10-CM | POA: Diagnosis not present

## 2016-12-10 DIAGNOSIS — L570 Actinic keratosis: Secondary | ICD-10-CM | POA: Diagnosis not present

## 2016-12-10 DIAGNOSIS — E785 Hyperlipidemia, unspecified: Secondary | ICD-10-CM

## 2016-12-10 NOTE — Telephone Encounter (Signed)
LVM for patient to call back and reschedule his 01/12/17 appointment with Chelle since she will be out of the office that day. Anyone can make him an appointment on her next available time slot.  Thank you!

## 2017-01-12 ENCOUNTER — Ambulatory Visit: Payer: Medicare Other | Admitting: Physician Assistant

## 2017-01-19 ENCOUNTER — Ambulatory Visit (INDEPENDENT_AMBULATORY_CARE_PROVIDER_SITE_OTHER): Payer: Medicare Other | Admitting: Physician Assistant

## 2017-01-19 ENCOUNTER — Encounter: Payer: Self-pay | Admitting: Physician Assistant

## 2017-01-19 VITALS — BP 109/61 | HR 99 | Temp 98.4°F | Resp 16 | Ht 73.0 in | Wt 239.0 lb

## 2017-01-19 DIAGNOSIS — I10 Essential (primary) hypertension: Secondary | ICD-10-CM | POA: Diagnosis not present

## 2017-01-19 DIAGNOSIS — B353 Tinea pedis: Secondary | ICD-10-CM | POA: Diagnosis not present

## 2017-01-19 DIAGNOSIS — L03116 Cellulitis of left lower limb: Secondary | ICD-10-CM | POA: Diagnosis not present

## 2017-01-19 DIAGNOSIS — E119 Type 2 diabetes mellitus without complications: Secondary | ICD-10-CM

## 2017-01-19 DIAGNOSIS — E785 Hyperlipidemia, unspecified: Secondary | ICD-10-CM | POA: Diagnosis not present

## 2017-01-19 MED ORDER — KETOCONAZOLE 2 % EX CREA: 1.0000 "application " | TOPICAL_CREAM | Freq: Three times a day (TID) | CUTANEOUS | 1 refills | Status: DC

## 2017-01-19 MED ORDER — AMOXICILLIN 875 MG PO TABS: 875.0000 mg | ORAL_TABLET | Freq: Two times a day (BID) | ORAL | 0 refills | Status: DC

## 2017-01-19 NOTE — Patient Instructions (Signed)
     IF you received an x-ray today, you will receive an invoice from [facility]. Please contact [facility] at [phone] with questions or concerns regarding your invoice.   IF you received labwork today, you will receive an invoice from [facility]. Please contact [facility] at [phone] with questions or concerns regarding your invoice.   Our billing staff will not be able to assist you with questions regarding bills from these companies.  You will be contacted with the lab results as soon as they are available. The fastest way to get your results is to activate your My Chart account. Instructions are located on the last page of this paperwork. If you have not heard from us regarding the results in 2 weeks, please contact this office.     

## 2017-01-19 NOTE — Progress Notes (Signed)
Patient ID: Daniel Ramirez, male    DOB: [birth year], 66 y.o.   MRN: [id number]  PCP: Harrison Mons, PA-C  Chief Complaint  Patient presents with  . Follow-up    3 months    Subjective:   Presents for evaluation of diabetes.  He feels well. Tolerating his medications without difficulty.  Last night he had sudden onset of GI upset and multiple bouts of diarrhea. Subjective fever and chills. Resolved in the early morning hours. Other than feeling sleep deprived, the symptoms have completely resolved. Has eaten today without difficulty.  Upon taking his shoes and socks off for today's diabetic foot exam, he noticed swelling and redness of the LEFT leg and foot. Not painful. Not itchy. No known triggers. No previous swelling or rash like this.   Review of Systems Denies chest pain, shortness of breath, HA, dizziness, vision change, constipation, melena, hematochezia, dysuria, increased urinary urgency or frequency, increased hunger or thirst, unintentional weight change, unexplained myalgias or arthralgias, rash.    Patient Active Problem List   Diagnosis Date Noted  . Diabetes ([place]) 05/07/2015  . Obesity 08/28/2014  . PFO (patent foramen ovale) 08/02/2012  . Hypertension 04/12/2012  . Hyperlipidemia 04/12/2012  . RBBB 04/12/2012  . Arthritis 04/12/2012     Prior to Admission medications   Medication Sig Start Date End Date Taking? Authorizing Provider  aspirin 81 MG tablet Take 81 mg by mouth daily.   Yes Historical Provider, MD  cetirizine (ZYRTEC) 10 MG tablet Take 10 mg by mouth daily.   Yes Historical Provider, MD  Cyanocobalamin (VITAMIN B 12 PO) Take by mouth.   Yes Historical Provider, MD  metFORMIN (GLUCOPHAGE) 500 MG tablet Take 1 tablet (500 mg total) by mouth 2 (two) times daily with a meal. 10/06/16  Yes Zenita Kister, PA-C  Multiple Vitamin (MULTIVITAMIN) tablet Take 1 tablet by mouth daily.   Yes Historical Provider, MD  olmesartan (BENICAR) 20 MG tablet  TAKE 1/2 (ONE-HALF) TABLET BY MOUTH DAILY. 10/06/16  Yes Maysel Mccolm, PA-C  pravastatin (PRAVACHOL) 40 MG tablet TAKE 1 TABLET BY MOUTH  DAILY 12/10/16  Yes Tereasa Coop, PA-C     No Known Allergies     Objective:  Physical Exam  Constitutional: He is oriented to person, place, and time. He appears well-developed and well-nourished. He is active and cooperative. No distress.  BP 109/61   Pulse 99   Temp 98.4 F (36.9 C) (Oral)   Resp 16   Ht 6\' 1"  (1.854 m)   Wt 239 lb (108.4 kg)   SpO2 98%   BMI 31.53 kg/m   HENT:  Head: Normocephalic and atraumatic.  Right Ear: Hearing normal.  Left Ear: Hearing normal.  Eyes: Conjunctivae are normal. No scleral icterus.  Neck: Normal range of motion. Neck supple. No thyromegaly present.  Cardiovascular: Normal rate, regular rhythm and normal heart sounds.   Pulses:      Radial pulses are 2+ on the right side, and 2+ on the left side.  Pulmonary/Chest: Effort normal and breath sounds normal.  Musculoskeletal:       Right ankle: Normal. Achilles tendon normal.       Left ankle: Normal. Achilles tendon normal.       Left lower leg: He exhibits swelling (mild). He exhibits no tenderness, no bony tenderness, no edema, no deformity and no laceration.       Right foot: There is normal range of motion, no tenderness, no bony tenderness, no  swelling ( ), normal capillary refill, no crepitus, no deformity and no laceration.       Left foot: There is swelling (LEFT forefoot). There is normal range of motion, no tenderness, no bony tenderness, normal capillary refill, no crepitus, no deformity and no laceration.  Lymphadenopathy:       Head (right side): No tonsillar, no preauricular, no posterior auricular and no occipital adenopathy present.       Head (left side): No tonsillar, no preauricular, no posterior auricular and no occipital adenopathy present.    He has no cervical adenopathy.       Right: No supraclavicular adenopathy present.        Left: No supraclavicular adenopathy present.  Neurological: He is alert and oriented to person, place, and time. No sensory deficit.  Skin: Skin is warm, dry and intact. Lesion and rash noted. Rash is macular (erythematous macules scattered on the LEFT anterior tibia) and papular (cluster of erythematous papules on the medial aspect of the LEFT great toe). There is erythema (increased rubor and warmth of the anterior LEFT tibia, extending from below the knee to just above the ankle). No cyanosis. Nails show no clubbing.  Psychiatric: He has a normal mood and affect. His speech is normal and behavior is normal.    Also examined by Dr. Reginia Forts.   Diabetic Foot Exam - Simple   Simple Foot Form Diabetic Foot exam was performed with the following findings:  Yes 01/19/2017  3:17 PM  Visual Inspection No deformities, no ulcerations, no other skin breakdown bilaterally:  Yes Sensation Testing Intact to touch and monofilament testing bilaterally:  Yes Pulse Check Posterior Tibialis and Dorsalis pulse intact bilaterally:  Yes Comments        Assessment & Plan:   1. Type 2 diabetes mellitus without complication, without long-term current use of insulin ([place]) Await labs. Adjust regimen as indicated by results. - Comprehensive metabolic panel - Hemoglobin A1c - HM Diabetes Foot Exam  2. Essential hypertension Controlled. - Comprehensive metabolic panel  3. Hyperlipidemia, unspecified hyperlipidemia type Await labs. Adjust regimen as indicated by results. - Comprehensive metabolic panel - Lipid panel  4. Tinea pedis of left foot - ketoconazole (NIZORAL) 2 % cream; Apply 1 application topically 3 (three) times daily. Apply until rash is gone, then 5 additional days  Dispense: 60 g; Refill: 1  5. Cellulitis of left lower extremity Suspect erysipelas. Likely secondary to the tinea.  - amoxicillin (AMOXIL) 875 MG tablet; Take 1 tablet (875 mg total) by mouth 2 (two) times daily.   Dispense: 20 tablet; Refill: 0   Return in about 3 months (around 04/21/2017), or if symptoms worsen or fail to improve.   Fara Chute, PA-C Physician Assistant-Certified Primary Care at [place]

## 2017-01-19 NOTE — Progress Notes (Signed)
Patient ID: Daniel Ramirez, male    DOB: Jan 20, 1951, 66 y.o.   MRN: [id number]  PCP: Harrison Mons, PA-C  Chief Complaint  Patient presents with  . Follow-up    3 months    Subjective:   Presents for DM follow-up and for left foot erythema. Also had a short duration fever (subjective) with chills starting yesterday afternoon and resolved around 0200am. Diarrhea this morning 2x times. Denies black or tarry stools or blood. DM seems to be well controled. No issues with his medication and sugars at home are normal for him. Denies increase in urination, numbness, tingling or lightlessness. Foot erythema and edema were first noticed here in the office when he removed his socks for the DM foot exam. Erythema continues up the leg to just distal the knee.    Review of Systems As written above  Patient Active Problem List   Diagnosis Date Noted  . Diabetes ([place]) 05/07/2015  . Obesity 08/28/2014  . PFO (patent foramen ovale) 08/02/2012  . Hypertension 04/12/2012  . Hyperlipidemia 04/12/2012  . RBBB 04/12/2012  . Arthritis 04/12/2012     Prior to Admission medications   Medication Sig Start Date End Date Taking? Authorizing Provider  aspirin 81 MG tablet Take 81 mg by mouth daily.   Yes Historical Provider, MD  cetirizine (ZYRTEC) 10 MG tablet Take 10 mg by mouth daily.   Yes Historical Provider, MD  Cyanocobalamin (VITAMIN B 12 PO) Take by mouth.   Yes Historical Provider, MD  metFORMIN (GLUCOPHAGE) 500 MG tablet Take 1 tablet (500 mg total) by mouth 2 (two) times daily with a meal. 10/06/16  Yes Chelle Jeffery, PA-C  Multiple Vitamin (MULTIVITAMIN) tablet Take 1 tablet by mouth daily.   Yes Historical Provider, MD  olmesartan (BENICAR) 20 MG tablet TAKE 1/2 (ONE-HALF) TABLET BY MOUTH DAILY. 10/06/16  Yes Chelle Jeffery, PA-C  pravastatin (PRAVACHOL) 40 MG tablet TAKE 1 TABLET BY MOUTH  DAILY 12/10/16  Yes Tereasa Coop, PA-C     No Known Allergies     Objective:  Physical Exam   Constitutional: He is oriented to person, place, and time. He appears well-developed and well-nourished.  Blood pressure 109/61, pulse 99, temperature 98.4 F (36.9 C), temperature source Oral, resp. rate 16, height 6\' 1"  (1.854 m), weight 239 lb (108.4 kg), SpO2 98 %.  HENT:  Head: Normocephalic.  Eyes: Pupils are equal, round, and reactive to light.  Cardiovascular: Normal rate, regular rhythm and normal heart sounds.   Pulmonary/Chest: Effort normal and breath sounds normal.  Neurological: He is alert and oriented to person, place, and time.  Skin: Skin is warm, dry and intact. Rash noted. There is erythema.     Psychiatric: He has a normal mood and affect. His behavior is normal. Judgment and thought content normal.    Assessment & Plan:  1. Type 2 diabetes mellitus without complication, without long-term current use of insulin ([facility]) - Comprehensive metabolic panel - Hemoglobin A1c - HM Diabetes Foot Exam  2. Essential hypertension - Comprehensive metabolic panel  3. Hyperlipidemia, unspecified hyperlipidemia type - Comprehensive metabolic panel - Lipid panel  4. Tinea pedis of left foot - ketoconazole (NIZORAL) 2 % cream; Apply 1 application topically 3 (three) times daily. Apply until rash is gone, then 5 additional days  Dispense: 60 g; Refill: 1  5. Cellulitis of left lower extremity - amoxicillin (AMOXIL) 875 MG tablet; Take 1 tablet (875 mg total) by mouth 2 (two) times daily.  Dispense: 20 tablet; Refill: 0 - Antistreptolysin O titer

## 2017-01-20 ENCOUNTER — Other Ambulatory Visit: Payer: Self-pay | Admitting: Physician Assistant

## 2017-01-20 DIAGNOSIS — I10 Essential (primary) hypertension: Secondary | ICD-10-CM | POA: Diagnosis not present

## 2017-01-20 DIAGNOSIS — L03116 Cellulitis of left lower limb: Secondary | ICD-10-CM | POA: Diagnosis not present

## 2017-01-20 DIAGNOSIS — E119 Type 2 diabetes mellitus without complications: Secondary | ICD-10-CM | POA: Diagnosis not present

## 2017-01-20 DIAGNOSIS — E785 Hyperlipidemia, unspecified: Secondary | ICD-10-CM | POA: Diagnosis not present

## 2017-01-21 LAB — COMPREHENSIVE METABOLIC PANEL
ALBUMIN: 4.1 g/dL (ref 3.6–4.8)
ALT: 25 IU/L (ref 0–44)
AST: 25 IU/L (ref 0–40)
Albumin/Globulin Ratio: 1.5 (ref 1.2–2.2)
Alkaline Phosphatase: 60 IU/L (ref 39–117)
BUN/Creatinine Ratio: 12 (ref 10–24)
BUN: 19 mg/dL (ref 8–27)
Bilirubin Total: 0.9 mg/dL (ref 0.0–1.2)
CALCIUM: 9.2 mg/dL (ref 8.6–10.2)
CO2: 20 mmol/L (ref 18–29)
CREATININE: 1.6 mg/dL — AB (ref 0.76–1.27)
Chloride: 96 mmol/L (ref 96–106)
GFR calc Af Amer: 51 mL/min/{1.73_m2} — ABNORMAL LOW (ref >59)
GFR, EST NON AFRICAN AMERICAN: 44 mL/min/{1.73_m2} — AB (ref >59)
GLOBULIN, TOTAL: 2.8 g/dL (ref 1.5–4.5)
GLUCOSE: 147 mg/dL — AB (ref 65–99)
Potassium: 4.2 mmol/L (ref 3.5–5.2)
SODIUM: 135 mmol/L (ref 134–144)
Total Protein: 6.9 g/dL (ref 6.0–8.5)

## 2017-01-21 LAB — ANTISTREPTOLYSIN O TITER: ASO: 82 IU/mL (ref 0.0–200.0)

## 2017-01-21 LAB — LIPID PANEL
CHOL/HDL RATIO: 3.4 ratio (ref 0.0–5.0)
Cholesterol, Total: 126 mg/dL (ref 100–199)
HDL: 37 mg/dL — ABNORMAL LOW (ref >39)
LDL CALC: 62 mg/dL (ref 0–99)
Triglycerides: 133 mg/dL (ref 0–149)
VLDL Cholesterol Cal: 27 mg/dL (ref 5–40)

## 2017-01-21 LAB — HEMOGLOBIN A1C
ESTIMATED AVERAGE GLUCOSE: 131 mg/dL
HEMOGLOBIN A1C: 6.2 % — AB (ref 4.8–5.6)

## 2017-01-22 ENCOUNTER — Ambulatory Visit (INDEPENDENT_AMBULATORY_CARE_PROVIDER_SITE_OTHER): Payer: Medicare Other | Admitting: Physician Assistant

## 2017-01-22 VITALS — BP 107/70 | HR 102 | Temp 98.4°F | Resp 17 | Ht 72.5 in | Wt 237.0 lb

## 2017-01-22 DIAGNOSIS — B353 Tinea pedis: Secondary | ICD-10-CM | POA: Diagnosis not present

## 2017-01-22 DIAGNOSIS — N289 Disorder of kidney and ureter, unspecified: Secondary | ICD-10-CM

## 2017-01-22 DIAGNOSIS — A46 Erysipelas: Secondary | ICD-10-CM | POA: Diagnosis not present

## 2017-01-22 NOTE — Patient Instructions (Addendum)
Continue with the elevation of the leg. You may apply moisturizer to the dry skin. Continue the antibiotic.    IF you received an x-ray today, you will receive an invoice from [facility]. Please contact [facility] at [phone] with questions or concerns regarding your invoice.   IF you received labwork today, you will receive an invoice from [facility]. Please contact [facility] at [phone] with questions or concerns regarding your invoice.   Our billing staff will not be able to assist you with questions regarding bills from these companies.  You will be contacted with the lab results as soon as they are available. The fastest way to get your results is to activate your My Chart account. Instructions are located on the last page of this paperwork. If you have not heard from [place] regarding the results in 2 weeks, please contact this office.

## 2017-01-22 NOTE — Progress Notes (Signed)
Patient ID: Daniel Ramirez, male    DOB: 01-31-1951, 66 y.o.   MRN: [id number]  PCP: Harrison Mons, PA-C  Chief Complaint  Patient presents with  . Follow-up    leg pain     Subjective:   Presents for evaluation of leg swelling and redness. He is accompanied by his wife.  Recall that he was seen for routine diabetes follow-up 3 days ago, and when he removed his socks and shoes for the foot exam, he noticed that the foot and leg on the LEFT were red and swollen. He had had subjective fever and diarrhea the previous night, but symptoms had resolved, and he had no idea that there was anything unusual on the leg until the exam.  Given the pattern of erythematous papules of the distal foot and toes, and then the scalded appearance of the lower leg, it was suspected that he had developed erysipelas secondary to a tinea pedis infection.  He was prescribed amoxicillin, rest, elevation, and recheck today.  ASO titer was NORMAL, at 82.0. Creatinine, BUN were up and GFR was down, most likely due to the GI illness he had the night preceding the last visit.  Today he notes that the swelling improves with elevation. It was nearly resolved until he sat in our lobby waiting for this appointment, when is recurred, but in the short time he has been in the exam room with the leg elevated again he can see significant improvement. He had some significant sensitivity of the red area on the leg yesterday, like a terrible sunburn, and the skin is intermittently mildly itchy, but no other pain. The redness has become much deeper and the redness on the foot appears to be in larger patches.    Review of Systems No fever, chills. No recurrent GI symptoms. No cough, SOB, CP. No nasal congestion, sore throat, ear pain.    Patient Active Problem List   Diagnosis Date Noted  . Diabetes ([place]) 05/07/2015  . Obesity 08/28/2014  . PFO (patent foramen ovale) 08/02/2012  . Hypertension 04/12/2012  .  Hyperlipidemia 04/12/2012  . RBBB 04/12/2012  . Arthritis 04/12/2012     Prior to Admission medications   Medication Sig Start Date End Date Taking? Authorizing Provider  amoxicillin (AMOXIL) 875 MG tablet Take 1 tablet (875 mg total) by mouth 2 (two) times daily. 01/19/17  Yes Carlissa Pesola, PA-C  aspirin 81 MG tablet Take 81 mg by mouth daily.   Yes Historical Provider, MD  cetirizine (ZYRTEC) 10 MG tablet Take 10 mg by mouth daily.   Yes Historical Provider, MD  Cyanocobalamin (VITAMIN B 12 PO) Take by mouth.   Yes Historical Provider, MD  ketoconazole (NIZORAL) 2 % cream Apply 1 application topically 3 (three) times daily. Apply until rash is gone, then 5 additional days 01/19/17  Yes Duru Reiger, PA-C  metFORMIN (GLUCOPHAGE) 500 MG tablet Take 1 tablet (500 mg total) by mouth 2 (two) times daily with a meal. 10/06/16  Yes Quaniya Damas, PA-C  Multiple Vitamin (MULTIVITAMIN) tablet Take 1 tablet by mouth daily.   Yes Historical Provider, MD  olmesartan (BENICAR) 20 MG tablet TAKE 1/2 (ONE-HALF) TABLET BY MOUTH DAILY. 10/06/16  Yes Cassidy Tashiro, PA-C  pravastatin (PRAVACHOL) 40 MG tablet TAKE 1 TABLET BY MOUTH  DAILY 12/10/16  Yes Tereasa Coop, PA-C     No Known Allergies     Objective:  Physical Exam  Constitutional: He is oriented to person, place, and time.  He appears well-developed and well-nourished. He is active and cooperative. No distress.  BP 107/70   Pulse (!) 102   Temp 98.4 F (36.9 C) (Oral)   Resp 17   Ht 6' 0.5" (1.842 m)   Wt 237 lb (107.5 kg)   SpO2 98%   BMI 31.70 kg/m   HENT:  Head: Normocephalic and atraumatic.  Right Ear: Hearing normal.  Left Ear: Hearing normal.  Eyes: Conjunctivae are normal. No scleral icterus.  Neck: Normal range of motion. Neck supple. No thyromegaly present.  Cardiovascular: Normal rate, regular rhythm and normal heart sounds.   Pulses:      Radial pulses are 2+ on the right side, and 2+ on the left side.    Pulmonary/Chest: Effort normal and breath sounds normal.  Lymphadenopathy:       Head (right side): No tonsillar, no preauricular, no posterior auricular and no occipital adenopathy present.       Head (left side): No tonsillar, no preauricular, no posterior auricular and no occipital adenopathy present.    He has no cervical adenopathy.       Right: No supraclavicular adenopathy present.       Left: No supraclavicular adenopathy present.  Neurological: He is alert and oriented to person, place, and time. No sensory deficit.  Skin: Skin is warm, dry and intact. Rash (some areas blanch.) noted. Rash is maculopapular. There is erythema. No cyanosis. Nails show no clubbing.  Psychiatric: He has a normal mood and affect. His speech is normal and behavior is normal.         Patient also examined by Dr. Delia Chimes.    Assessment & Plan:   1. Erysipelas of lower extremity 2. Tinea pedis of left foot Suspect that the increased violaceousness is due to capillary bleeding from the edema and does not represent worsening infection. Continue with elevation and amoxicillin. Apply Cetaphil moisturizer to the skin as needed. Stay well hydrated. Recheck on Tuesday 3/27.  3. Renal insufficiency Expect resolution. Await labs. - Comprehensive metabolic panel   Fara Chute, PA-C Physician Assistant-Certified Primary Care at [place]

## 2017-01-23 LAB — COMPREHENSIVE METABOLIC PANEL
ALK PHOS: 66 IU/L (ref 39–117)
ALT: 38 IU/L (ref 0–44)
AST: 31 IU/L (ref 0–40)
Albumin/Globulin Ratio: 1.5 (ref 1.2–2.2)
Albumin: 4.1 g/dL (ref 3.6–4.8)
BILIRUBIN TOTAL: 0.4 mg/dL (ref 0.0–1.2)
BUN/Creatinine Ratio: 16 (ref 10–24)
BUN: 20 mg/dL (ref 8–27)
CHLORIDE: 98 mmol/L (ref 96–106)
CO2: 23 mmol/L (ref 18–29)
CREATININE: 1.23 mg/dL (ref 0.76–1.27)
Calcium: 9.3 mg/dL (ref 8.6–10.2)
GFR calc Af Amer: 70 mL/min/{1.73_m2} (ref >59)
GFR calc non Af Amer: 61 mL/min/{1.73_m2} (ref >59)
GLOBULIN, TOTAL: 2.8 g/dL (ref 1.5–4.5)
Glucose: 126 mg/dL — ABNORMAL HIGH (ref 65–99)
POTASSIUM: 4.3 mmol/L (ref 3.5–5.2)
SODIUM: 139 mmol/L (ref 134–144)
Total Protein: 6.9 g/dL (ref 6.0–8.5)

## 2017-01-26 ENCOUNTER — Ambulatory Visit (INDEPENDENT_AMBULATORY_CARE_PROVIDER_SITE_OTHER): Payer: Medicare Other | Admitting: Physician Assistant

## 2017-01-26 ENCOUNTER — Encounter: Payer: Self-pay | Admitting: Physician Assistant

## 2017-01-26 VITALS — BP 98/65 | HR 93 | Temp 98.0°F | Resp 16 | Ht 77.0 in | Wt 237.0 lb

## 2017-01-26 DIAGNOSIS — B353 Tinea pedis: Secondary | ICD-10-CM

## 2017-01-26 DIAGNOSIS — A46 Erysipelas: Secondary | ICD-10-CM | POA: Diagnosis not present

## 2017-01-26 LAB — COMPREHENSIVE METABOLIC PANEL

## 2017-01-26 LAB — HEMOGLOBIN A1C
ESTIMATED AVERAGE GLUCOSE: 128 mg/dL
Hgb A1c MFr Bld: 6.1 % — ABNORMAL HIGH (ref 4.8–5.6)

## 2017-01-26 LAB — LIPID PANEL

## 2017-01-26 NOTE — Patient Instructions (Addendum)
Keep up the great work!!!    IF you received an x-ray today, you will receive an invoice from [facility]. Please contact  Radiology at [phone] with questions or concerns regarding your invoice.   IF you received labwork today, you will receive an invoice from [facility]. Please contact [facility] at [phone] with questions or concerns regarding your invoice.   Our billing staff will not be able to assist you with questions regarding bills from these companies.  You will be contacted with the lab results as soon as they are available. The fastest way to get your results is to activate your My Chart account. Instructions are located on the last page of this paperwork. If you have not heard from us regarding the results in 2 weeks, please contact this office.     

## 2017-01-26 NOTE — Progress Notes (Signed)
Patient ID: Daniel Ramirez, male    DOB: 08/23/1951, 66 y.o.   MRN: [id number]  PCP: Harrison Mons, PA-C  Chief Complaint  Patient presents with  . Follow-up    tinea pedis of left foot    Subjective:   Presents for evaluation of erysipelas of the LEFT lower leg. He is accompanied by his wife.  Continues to tolerate amoxicillin and use ketoconazole TID. Is elevating the leg as much as possible and has seen significant improvement. Notes that when he is up and about, even for brief periods of time, the swelling worsens and the discoloration is more violaceous, and then it improves again with elevation.  No pain. Itching resolved with lubrication.   Review of Systems As above.    Patient Active Problem List   Diagnosis Date Noted  . Diabetes ([place]) 05/07/2015  . Obesity 08/28/2014  . PFO (patent foramen ovale) 08/02/2012  . Hypertension 04/12/2012  . Hyperlipidemia 04/12/2012  . RBBB 04/12/2012  . Arthritis 04/12/2012     Prior to Admission medications   Medication Sig Start Date End Date Taking? Authorizing Provider  amoxicillin (AMOXIL) 875 MG tablet Take 1 tablet (875 mg total) by mouth 2 (two) times daily. 01/19/17  Yes Wyatt Thorstenson, PA-C  aspirin 81 MG tablet Take 81 mg by mouth daily.   Yes Historical Provider, MD  cetirizine (ZYRTEC) 10 MG tablet Take 10 mg by mouth daily.   Yes Historical Provider, MD  Cyanocobalamin (VITAMIN B 12 PO) Take by mouth.   Yes Historical Provider, MD  ketoconazole (NIZORAL) 2 % cream Apply 1 application topically 3 (three) times daily. Apply until rash is gone, then 5 additional days 01/19/17  Yes Tzirel Leonor, PA-C  metFORMIN (GLUCOPHAGE) 500 MG tablet Take 1 tablet (500 mg total) by mouth 2 (two) times daily with a meal. 10/06/16  Yes Benancio Osmundson, PA-C  Multiple Vitamin (MULTIVITAMIN) tablet Take 1 tablet by mouth daily.   Yes Historical Provider, MD  olmesartan (BENICAR) 20 MG tablet TAKE 1/2 (ONE-HALF) TABLET BY MOUTH  DAILY. 10/06/16  Yes Jazzmin Newbold, PA-C  pravastatin (PRAVACHOL) 40 MG tablet TAKE 1 TABLET BY MOUTH  DAILY 12/10/16  Yes Tereasa Coop, PA-C     No Known Allergies     Objective:  Physical Exam  Constitutional: He is oriented to person, place, and time. He appears well-developed and well-nourished. He is active and cooperative. No distress.  BP 98/65   Pulse 93   Temp 98 F (36.7 C) (Oral)   Resp 16   Ht 6\' 5"  (1.956 m)   Wt 237 lb (107.5 kg)   SpO2 96%   BMI 28.10 kg/m    Eyes: Conjunctivae are normal.  Pulmonary/Chest: Effort normal.  Neurological: He is alert and oriented to person, place, and time.  Skin: Skin is warm, dry and intact. Rash noted. Rash is maculopapular. He is not diaphoretic. There is erythema.  Edema is nearly resolved, with the leg elevated. Violaceous color is diminished significantly since last visit. Lesions are now blanching.   Psychiatric: He has a normal mood and affect. His speech is normal and behavior is normal.           Assessment & Plan:   1. Erysipelas of lower extremity 2. Tinea pedis of left foot Improving. Continue with elevation. Complete amoxicillin. Continue ketoconazole until lesions are resolved and then 5 additional days. RTC if worsens again or does not completely resolve.   Fara Chute, PA-C  Physician Assistant-Certified Primary Care at [place]

## 2017-03-09 DIAGNOSIS — H5213 Myopia, bilateral: Secondary | ICD-10-CM | POA: Diagnosis not present

## 2017-03-09 DIAGNOSIS — H35371 Puckering of macula, right eye: Secondary | ICD-10-CM | POA: Diagnosis not present

## 2017-03-09 DIAGNOSIS — H25042 Posterior subcapsular polar age-related cataract, left eye: Secondary | ICD-10-CM | POA: Diagnosis not present

## 2017-03-09 DIAGNOSIS — E119 Type 2 diabetes mellitus without complications: Secondary | ICD-10-CM | POA: Diagnosis not present

## 2017-03-09 LAB — HM DIABETES EYE EXAM

## 2017-03-11 ENCOUNTER — Encounter: Payer: Self-pay | Admitting: Physician Assistant

## 2017-03-11 ENCOUNTER — Ambulatory Visit (INDEPENDENT_AMBULATORY_CARE_PROVIDER_SITE_OTHER): Payer: Medicare Other | Admitting: Physician Assistant

## 2017-03-11 VITALS — BP 121/77 | HR 90 | Temp 98.2°F | Resp 17 | Ht 77.0 in | Wt 244.0 lb

## 2017-03-11 DIAGNOSIS — J019 Acute sinusitis, unspecified: Secondary | ICD-10-CM | POA: Diagnosis not present

## 2017-03-11 MED ORDER — GUAIFENESIN ER 1200 MG PO TB12: 1.0000 | ORAL_TABLET | Freq: Two times a day (BID) | ORAL | 1 refills | Status: DC | PRN

## 2017-03-11 MED ORDER — AMOXICILLIN-POT CLAVULANATE 875-125 MG PO TABS: 1.0000 | ORAL_TABLET | Freq: Two times a day (BID) | ORAL | 0 refills | Status: DC

## 2017-03-11 MED ORDER — BENZONATATE 100 MG PO CAPS: 100.0000 mg | ORAL_CAPSULE | Freq: Three times a day (TID) | ORAL | 0 refills | Status: DC | PRN

## 2017-03-11 MED ORDER — AZELASTINE HCL 0.1 % NA SOLN: 2.0000 | Freq: Two times a day (BID) | NASAL | 0 refills | Status: DC

## 2017-03-11 NOTE — Patient Instructions (Addendum)
Get plenty of rest and drink at least 64 ounces of water daily.    IF you received an x-ray today, you will receive an invoice from [facility]. Please contact [facility] at [phone] with questions or concerns regarding your invoice.   IF you received labwork today, you will receive an invoice from [facility]. Please contact [facility] at [phone] with questions or concerns regarding your invoice.   Our billing staff will not be able to assist you with questions regarding bills from these companies.  You will be contacted with the lab results as soon as they are available. The fastest way to get your results is to activate your My Chart account. Instructions are located on the last page of this paperwork. If you have not heard from us regarding the results in 2 weeks, please contact this office.      

## 2017-03-11 NOTE — Progress Notes (Signed)
Patient ID: Daniel Ramirez, male    DOB: [birth year], 66 y.o.   MRN: [id number]  PCP: Harrison Mons, PA-C  Chief Complaint  Patient presents with  . Cough    onset 5-6 days    Subjective:   Presents for evaluation of cough, sinus pressure and drainage x 7+ days.  Worsening x 2-3 days. Dizziness with rapid position change, resolves quickly. No fever, chills. No nausea, but coughing jags sometimes cause nausea and gagging. No diarrhea. No unexplained muscle/joint aches. No HA.  "I think I have bronchitis." No sick contacts, but his wife developed a cough today.    Review of Systems As above.    Patient Active Problem List   Diagnosis Date Noted  . Diabetes ([place]) 05/07/2015  . Obesity 08/28/2014  . PFO (patent foramen ovale) 08/02/2012  . Hypertension 04/12/2012  . Hyperlipidemia 04/12/2012  . RBBB 04/12/2012  . Arthritis 04/12/2012     Prior to Admission medications   Medication Sig Start Date End Date Taking? Authorizing Provider  aspirin 81 MG tablet Take 81 mg by mouth daily.   Yes [provider]  cetirizine (ZYRTEC) 10 MG tablet Take 10 mg by mouth daily.   Yes [provider]  metFORMIN (GLUCOPHAGE) 500 MG tablet Take 1 tablet (500 mg total) by mouth 2 (two) times daily with a meal. 10/06/16  Yes Gabriel Conry, PA-C  Multiple Vitamin (MULTIVITAMIN) tablet Take 1 tablet by mouth daily.   Yes [provider]  olmesartan (BENICAR) 20 MG tablet TAKE 1/2 (ONE-HALF) TABLET BY MOUTH DAILY. 10/06/16  Yes Der Gagliano, PA-C  pravastatin (PRAVACHOL) 40 MG tablet TAKE 1 TABLET BY MOUTH  DAILY 12/10/16  Yes Tereasa Coop, PA-C     No Known Allergies     Objective:  Physical Exam  Constitutional: He is oriented to person, place, and time. He appears well-developed and well-nourished. No distress.  BP 121/77 (BP Location: Right Arm, Patient Position: Sitting, Cuff Size: Large)   Pulse 90   Temp 98.2 F (36.8 C) (Oral)   Resp  17   Ht 6\' 5"  (1.956 m)   Wt 244 lb (110.7 kg)   SpO2 94%   BMI 28.93 kg/m    HENT:  Head: Normocephalic and atraumatic.  Right Ear: Hearing, tympanic membrane, external ear and ear canal normal.  Left Ear: Hearing, tympanic membrane, external ear and ear canal normal.  Nose: Mucosal edema and rhinorrhea present.  No foreign bodies. Right sinus exhibits no maxillary sinus tenderness and no frontal sinus tenderness. Left sinus exhibits no maxillary sinus tenderness and no frontal sinus tenderness.  Mouth/Throat: Uvula is midline, oropharynx is clear and moist and mucous membranes are normal. No uvula swelling. No oropharyngeal exudate.  Eyes: Conjunctivae and EOM are normal. Pupils are equal, round, and reactive to light. Right eye exhibits no discharge. Left eye exhibits no discharge. No scleral icterus.  Neck: Trachea normal, normal range of motion and full passive range of motion without pain. Neck supple. No thyroid mass and no thyromegaly present.  Cardiovascular: Normal rate, regular rhythm and normal heart sounds.   Pulmonary/Chest: Effort normal and breath sounds normal.  Lymphadenopathy:       Head (right side): No submandibular, no tonsillar, no preauricular, no posterior auricular and no occipital adenopathy present.       Head (left side): No submandibular, no tonsillar, no preauricular and no occipital adenopathy present.    He has no cervical adenopathy.  Right: No supraclavicular adenopathy present.       Left: No supraclavicular adenopathy present.  Neurological: He is alert and oriented to person, place, and time. He has normal strength. No cranial nerve deficit or sensory deficit.  Skin: Skin is warm, dry and intact. No rash noted.  Psychiatric: He has a normal mood and affect. His speech is normal and behavior is normal.       Assessment & Plan:   1. Acute non-recurrent sinusitis, unspecified location Supportive care.  Anticipatory guidance.  RTC if symptoms  worsen/persist. - amoxicillin-clavulanate (AUGMENTIN) 875-125 MG tablet; Take 1 tablet by mouth 2 (two) times daily.  Dispense: 20 tablet; Refill: 0 - azelastine (ASTELIN) 0.1 % nasal spray; Place 2 sprays into both nostrils 2 (two) times daily. Use in each nostril as directed  Dispense: 30 mL; Refill: 0 - benzonatate (TESSALON) 100 MG capsule; Take 1-2 capsules (100-200 mg total) by mouth 3 (three) times daily as needed for cough.  Dispense: 40 capsule; Refill: 0 - Guaifenesin (MUCINEX MAXIMUM STRENGTH) 1200 MG TB12; Take 1 tablet (1,200 mg total) by mouth every 12 (twelve) hours as needed.  Dispense: 14 tablet; Refill: 1    Return for follow-up as planned.Fara Chute, PA-C [facility]

## 2017-03-17 ENCOUNTER — Telehealth: Payer: Self-pay | Admitting: Physician Assistant

## 2017-03-17 NOTE — Telephone Encounter (Signed)
Pt is stating that the tessalon rx was too expensive an he is hoping that something else can be called in or him given a different RX to search for a cheaper pharmacy   Best number [phone]

## 2017-03-17 NOTE — Telephone Encounter (Signed)
Pt request his medication to be called into the [facility] at [place] in [place].  Medication:  amoxicillin-clavulanate azelastine benzonatate Guaifenesin  He also states that he has not had his medication for a week now.

## 2017-03-18 MED ORDER — AMOXICILLIN-POT CLAVULANATE 875-125 MG PO TABS: 1.0000 | ORAL_TABLET | Freq: Two times a day (BID) | ORAL | 0 refills | Status: AC

## 2017-03-18 MED ORDER — BENZONATATE 100 MG PO CAPS: 100.0000 mg | ORAL_CAPSULE | Freq: Three times a day (TID) | ORAL | 0 refills | Status: DC | PRN

## 2017-03-18 NOTE — Addendum Note (Signed)
Addended by: Burnis Kingfisher on: 03/18/2017 12:19 PM   Modules accepted: Orders

## 2017-04-27 ENCOUNTER — Ambulatory Visit (INDEPENDENT_AMBULATORY_CARE_PROVIDER_SITE_OTHER): Payer: Medicare Other | Admitting: Physician Assistant

## 2017-04-27 ENCOUNTER — Encounter: Payer: Self-pay | Admitting: Physician Assistant

## 2017-04-27 VITALS — BP 111/74 | HR 76 | Temp 97.9°F | Resp 18 | Ht 73.0 in | Wt 238.6 lb

## 2017-04-27 DIAGNOSIS — I10 Essential (primary) hypertension: Secondary | ICD-10-CM

## 2017-04-27 DIAGNOSIS — E785 Hyperlipidemia, unspecified: Secondary | ICD-10-CM

## 2017-04-27 DIAGNOSIS — Z6831 Body mass index (BMI) 31.0-31.9, adult: Secondary | ICD-10-CM

## 2017-04-27 DIAGNOSIS — E119 Type 2 diabetes mellitus without complications: Secondary | ICD-10-CM

## 2017-04-27 MED ORDER — PRAVASTATIN SODIUM 40 MG PO TABS: 40.0000 mg | ORAL_TABLET | Freq: Every day | ORAL | 1 refills | Status: DC

## 2017-04-27 NOTE — Assessment & Plan Note (Signed)
Has been well controlled. Continue current treatment, pending lab results.

## 2017-04-27 NOTE — Assessment & Plan Note (Signed)
Controlled. Continue current treatment. 

## 2017-04-27 NOTE — Assessment & Plan Note (Signed)
Await labs. Adjust regimen as indicated by results.  

## 2017-04-27 NOTE — Progress Notes (Signed)
Patient ID: Daniel Ramirez, male    DOB: June 24, 1951, 66 y.o.   MRN: [id number]  PCP: Harrison Mons, PA-C  Chief Complaint  Patient presents with  . Diabetes    3 month follow up  . Follow-up    Subjective:   Presents for evaluation of diabetes.  Feels well. Not having any problems or concerns. Tolerating current medications without adverse effects. Diabetes well controlled, A1C 6.2% 12/2016.  No urinary urgency, frequency. No visual changes. No nausea.  Working on [employer]. Works to stay hydrated. Very physical job, but no exercise outside of that.   Review of Systems  Constitutional: Negative for activity change, appetite change, fatigue and unexpected weight change.  HENT: Negative for congestion, dental problem, ear pain, hearing loss, mouth sores, postnasal drip, rhinorrhea, sneezing, sore throat, tinnitus and trouble swallowing.   Eyes: Negative for photophobia, pain, redness and visual disturbance.  Respiratory: Negative for cough, chest tightness and shortness of breath.   Cardiovascular: Negative for chest pain, palpitations and leg swelling.  Gastrointestinal: Negative for abdominal pain, blood in stool, constipation, diarrhea, nausea and vomiting.  Endocrine: Negative for cold intolerance, heat intolerance, polydipsia, polyphagia and polyuria.  Genitourinary: Negative for dysuria, frequency, hematuria and urgency.  Musculoskeletal: Negative for arthralgias, gait problem, myalgias and neck stiffness.  Skin: Negative for rash.  Neurological: Negative for dizziness, speech difficulty, weakness, light-headedness, numbness and headaches.  Hematological: Negative for adenopathy.  Psychiatric/Behavioral: Negative for confusion and sleep disturbance. The patient is not nervous/anxious.        Patient Active Problem List   Diagnosis Date Noted  . Diabetes ([place]) 05/07/2015  . Obesity 08/28/2014  . PFO (patent foramen ovale) 08/02/2012  . Hypertension  04/12/2012  . Hyperlipidemia 04/12/2012  . RBBB 04/12/2012  . Arthritis 04/12/2012     Prior to Admission medications   Medication Sig Start Date End Date Taking? Authorizing Provider  aspirin 81 MG tablet Take 81 mg by mouth daily.   Yes [provider]  cetirizine (ZYRTEC) 10 MG tablet Take 10 mg by mouth daily.   Yes [provider]  metFORMIN (GLUCOPHAGE) 500 MG tablet Take 1 tablet (500 mg total) by mouth 2 (two) times daily with a meal. 10/06/16  Yes Abbie Jablon, PA-C  Multiple Vitamin (MULTIVITAMIN) tablet Take 1 tablet by mouth daily.   Yes [provider]  olmesartan (BENICAR) 20 MG tablet TAKE 1/2 (ONE-HALF) TABLET BY MOUTH DAILY. 10/06/16  Yes Kendahl Bumgardner, PA-C  pravastatin (PRAVACHOL) 40 MG tablet TAKE 1 TABLET BY MOUTH  DAILY 12/10/16  Yes Tereasa Coop, PA-C  azelastine (ASTELIN) 0.1 % nasal spray Place 2 sprays into both nostrils 2 (two) times daily. Use in each nostril as directed Patient not taking: Reported on 04/27/2017 03/11/17   Harrison Mons, PA-C  Guaifenesin (MUCINEX MAXIMUM STRENGTH) 1200 MG TB12 Take 1 tablet (1,200 mg total) by mouth every 12 (twelve) hours as needed. Patient not taking: Reported on 04/27/2017 03/11/17   Harrison Mons, PA-C     No Known Allergies     Objective:  Physical Exam  Constitutional: He is oriented to person, place, and time. He appears well-developed and well-nourished. He is active and cooperative. No distress.  BP 111/74 (BP Location: Right Arm, Patient Position: Sitting, Cuff Size: Large)   Pulse 76   Temp 97.9 F (36.6 C) (Oral)   Resp 18   Ht 6\' 1"  (1.854 m)   Wt 238 lb 9.6 oz (108.2 kg)   SpO2  96%   BMI 31.48 kg/m   HENT:  Head: Normocephalic and atraumatic.  Right Ear: Hearing normal.  Left Ear: Hearing normal.  Eyes: Conjunctivae are normal. No scleral icterus.  Neck: Normal range of motion. Neck supple. No thyromegaly present.  Cardiovascular: Normal rate, regular rhythm and  normal heart sounds.   Pulses:      Radial pulses are 2+ on the right side, and 2+ on the left side.  Pulmonary/Chest: Effort normal and breath sounds normal.  Lymphadenopathy:       Head (right side): No tonsillar, no preauricular, no posterior auricular and no occipital adenopathy present.       Head (left side): No tonsillar, no preauricular, no posterior auricular and no occipital adenopathy present.    He has no cervical adenopathy.       Right: No supraclavicular adenopathy present.       Left: No supraclavicular adenopathy present.  Neurological: He is alert and oriented to person, place, and time. No sensory deficit.  Skin: Skin is warm, dry and intact. No rash noted. No cyanosis or erythema. Nails show no clubbing.  Psychiatric: He has a normal mood and affect. His speech is normal and behavior is normal.           Assessment & Plan:   Problem List Items Addressed This Visit    Hypertension    Controlled. Continue current treatment.      Relevant Medications   pravastatin (PRAVACHOL) 40 MG tablet   Hyperlipidemia    Await labs. Adjust regimen as indicated by results.       Relevant Medications   pravastatin (PRAVACHOL) 40 MG tablet   Other Relevant Orders   Lipid panel   Comprehensive metabolic panel   BMI 61.6-83.7,GBMSX   Diabetes ([place]) - Primary    Has been well controlled. Continue current treatment, pending lab results.      Relevant Medications   pravastatin (PRAVACHOL) 40 MG tablet   Other Relevant Orders   Hemoglobin A1c       Return in about 4 months (around 08/27/2017) for Diabetes, HTN, HLD .   Fara Chute, PA-C [facility]

## 2017-04-27 NOTE — Patient Instructions (Addendum)
  Keep up the great work and have a delightful summer!  IF you received an x-ray today, you will receive an invoice from [facility]. Please contact [facility] at [phone] with questions or concerns regarding your invoice.   IF you received labwork today, you will receive an invoice from [facility]. Please contact [facility] at [phone] with questions or concerns regarding your invoice.   Our billing staff will not be able to assist you with questions regarding bills from these companies.  You will be contacted with the lab results as soon as they are available. The fastest way to get your results is to activate your My Chart account. Instructions are located on the last page of this paperwork. If you have not heard from [place] regarding the results in 2 weeks, please contact this office.

## 2017-04-27 NOTE — Progress Notes (Signed)
Subjective:    Patient ID: Daniel Ramirez, male    DOB: [birth year], 66 y.o.   MRN: [id number] PCP: Harrison Mons, PA-C Chief Complaint  Patient presents with  . Diabetes    3 month follow up  . Follow-up    HPI: 66 y/o M presents today for follow up of diabetes.  Does regular foot checks at home. Continues to take the Metformin 500 mg BID. Does not check blood sugars at home, but feels like his sugars are well controlled. Denies urinary frequenc, urinary urgency and unintentional weight loss. Previous A1c in 12/2016: 6.2.  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 Denies tingling/ paresthesias in his hands and feet.  Diet- Trying to make healthy choices. Works hard to make sure he stays hydrated.  Exercise- active at work. Does a lot of walking at work.  Sleep- get about 7 hours a night.  Sees ophthalmogist regularly- and has cataracts.   Patient Active Problem List   Diagnosis Date Noted  . Diabetes ([place]) 05/07/2015  . Obesity 08/28/2014  . PFO (patent foramen ovale) 08/02/2012  . Hypertension 04/12/2012  . Hyperlipidemia 04/12/2012  . RBBB 04/12/2012  . Arthritis 04/12/2012   Past Medical History:  Diagnosis Date  . Allergy   . Arthritis   . Cardiac arrhythmia   . Cataract   . Cataract, left eye   . Detached retina   . Diverticulosis   . Gallstones   . HTN (hypertension)   . Internal hemorrhoid   . Pneumonia    Prior to Admission medications   Medication Sig Start Date End Date Taking? Authorizing Provider  aspirin 81 MG tablet Take 81 mg by mouth daily.   Yes [provider]  cetirizine (ZYRTEC) 10 MG tablet Take 10 mg by mouth daily.   Yes [provider]  metFORMIN (GLUCOPHAGE) 500 MG tablet Take 1 tablet (500 mg total) by mouth 2 (two) times daily with a meal. 10/06/16  Yes Jeffery, Chelle, PA-C  Multiple Vitamin (MULTIVITAMIN) tablet Take 1 tablet by mouth daily.   Yes [provider]  olmesartan (BENICAR) 20 MG tablet TAKE 1/2  (ONE-HALF) TABLET BY MOUTH DAILY. 10/06/16  Yes Jeffery, Chelle, PA-C  pravastatin (PRAVACHOL) 40 MG tablet TAKE 1 TABLET BY MOUTH  DAILY 12/10/16  Yes Tereasa Coop, PA-C  azelastine (ASTELIN) 0.1 % nasal spray Place 2 sprays into both nostrils 2 (two) times daily. Use in each nostril as directed Patient not taking: Reported on 04/27/2017 03/11/17   Harrison Mons, PA-C  Guaifenesin (MUCINEX MAXIMUM STRENGTH) 1200 MG TB12 Take 1 tablet (1,200 mg total) by mouth every 12 (twelve) hours as needed. Patient not taking: Reported on 04/27/2017 03/11/17   Harrison Mons, PA-C   No Known Allergies  Review of Systems  Constitutional: Negative for chills and fever.  HENT: Negative for congestion, postnasal drip, rhinorrhea and sinus pressure.   Eyes: Negative.  Negative for visual disturbance.  Respiratory: Negative for cough, chest tightness and shortness of breath.   Gastrointestinal: Negative for abdominal pain, blood in stool, constipation, diarrhea, nausea and vomiting.  Endocrine: Negative.   Genitourinary: Negative for dysuria, frequency, hematuria and urgency.  Musculoskeletal: Positive for arthralgias (knees bilaterally.).  Allergic/Immunologic: Negative.   Neurological: Negative for dizziness, light-headedness, numbness and headaches.  Hematological: Negative.   Psychiatric/Behavioral: Negative.  Negative for sleep disturbance.       Objective:   Physical Exam  Constitutional: He is oriented to person, place, and time. He appears well-developed and  well-nourished. No distress.  BP 111/74 (BP Location: Right Arm, Patient Position: Sitting, Cuff Size: Large)   Pulse 76   Temp 97.9 F (36.6 C) (Oral)   Resp 18   Ht 6\' 1"  (1.854 m)   Wt 238 lb 9.6 oz (108.2 kg)   SpO2 96%   BMI 31.48 kg/m    HENT:  Head: Normocephalic and atraumatic.  Eyes: Conjunctivae and EOM are normal. Pupils are equal, round, and reactive to light.  Neck: Normal range of motion. Neck supple. No thyromegaly  present.  Cardiovascular: Normal rate, regular rhythm and intact distal pulses.   Murmur heard. Pulmonary/Chest: Effort normal and breath sounds normal.  Lymphadenopathy:    He has no cervical adenopathy.  Neurological: He is alert and oriented to person, place, and time.  Skin: Skin is warm and dry. He is not diaphoretic.  Psychiatric: He has a normal mood and affect. His behavior is normal. Judgment and thought content normal.       Assessment & Plan:  1. Type 2 diabetes mellitus without complication, without long-term current use of insulin (HCC) Condition well controlled with Metformin 500 mg BID. Continue taking medication as prescribed.  - Hemoglobin A1c  2. Hyperlipidemia, unspecified hyperlipidemia type Condition well controlled with Pravastatin Sodium 40 mg tablet. Continue taking medication as prescribed. - Lipid panel - Comprehensive metabolic panel  3. Essential hypertension Condition well controlled with half a tablet of Benicar. Continue taking medication as prescribed.   4. BMI 31.0-31.9,adult Discussed working on improving diet, weight loss and staying hydrated.   Return in about 3 months (around 07/28/2017) for Diabetes, HTN, HLD .

## 2017-04-28 LAB — COMPREHENSIVE METABOLIC PANEL
ALBUMIN: 4.5 g/dL (ref 3.6–4.8)
ALT: 21 IU/L (ref 0–44)
AST: 23 IU/L (ref 0–40)
Albumin/Globulin Ratio: 1.7 (ref 1.2–2.2)
Alkaline Phosphatase: 64 IU/L (ref 39–117)
BUN / CREAT RATIO: 14 (ref 10–24)
BUN: 17 mg/dL (ref 8–27)
Bilirubin Total: 0.5 mg/dL (ref 0.0–1.2)
CALCIUM: 9.9 mg/dL (ref 8.6–10.2)
CO2: 26 mmol/L (ref 20–29)
CREATININE: 1.24 mg/dL (ref 0.76–1.27)
Chloride: 103 mmol/L (ref 96–106)
GFR calc Af Amer: 70 mL/min/{1.73_m2} (ref >59)
GFR, EST NON AFRICAN AMERICAN: 60 mL/min/{1.73_m2} (ref >59)
GLOBULIN, TOTAL: 2.6 g/dL (ref 1.5–4.5)
GLUCOSE: 112 mg/dL — AB (ref 65–99)
Potassium: 4.8 mmol/L (ref 3.5–5.2)
Sodium: 142 mmol/L (ref 134–144)
Total Protein: 7.1 g/dL (ref 6.0–8.5)

## 2017-04-28 LAB — HEMOGLOBIN A1C
ESTIMATED AVERAGE GLUCOSE: 128 mg/dL
Hgb A1c MFr Bld: 6.1 % — ABNORMAL HIGH (ref 4.8–5.6)

## 2017-04-28 LAB — LIPID PANEL
Chol/HDL Ratio: 3.4 ratio (ref 0.0–5.0)
Cholesterol, Total: 134 mg/dL (ref 100–199)
HDL: 40 mg/dL (ref >39)
LDL Calculated: 71 mg/dL (ref 0–99)
Triglycerides: 114 mg/dL (ref 0–149)
VLDL Cholesterol Cal: 23 mg/dL (ref 5–40)

## 2017-08-11 ENCOUNTER — Telehealth: Payer: Self-pay

## 2017-08-11 NOTE — Telephone Encounter (Signed)
Called pt to schedule Medicare Annual Wellness Visit with nurse health advisor.   Daniel Ramirez, B.A.  Care Guide [phone]

## 2017-08-19 ENCOUNTER — Ambulatory Visit (INDEPENDENT_AMBULATORY_CARE_PROVIDER_SITE_OTHER): Payer: Medicare Other

## 2017-08-19 VITALS — BP 124/82 | HR 84 | Ht 73.0 in | Wt 240.4 lb

## 2017-08-19 DIAGNOSIS — Z0000 Encounter for general adult medical examination without abnormal findings: Secondary | ICD-10-CM | POA: Diagnosis not present

## 2017-08-19 NOTE — Patient Instructions (Addendum)
[name] , Thank you for taking time to come for your Medicare Wellness Visit. I appreciate your ongoing commitment to your health goals. Please review the following plan we discussed and let me know if I can assist you in the future.   Screening recommendations/referrals: Colonoscopy: up to date, next due 11/24/2023 Recommended yearly ophthalmology/optometry visit for glaucoma screening and checkup Recommended yearly dental visit for hygiene and checkup  Vaccinations: Influenza vaccine: declined Pneumococcal vaccine: up to date Tdap vaccine: up to date, next due 02/06/2018 Shingles vaccine: declined  Advanced directives: Please bring a copy of your POA (Power of [place]) and/or Living Will to your next appointment.   Conditions/risks identified: You declined a healthcare goal at this time.   Next appointment: 08/24/2017 @ 8 am with [place] 65 Years and Older, Male Preventive care refers to lifestyle choices and visits with your health care provider that can promote health and wellness. What does preventive care include?  A yearly physical exam. This is also called an annual well check.  Dental exams once or twice a year.  Routine eye exams. Ask your health care provider how often you should have your eyes checked.  Personal lifestyle choices, including:  Daily care of your teeth and gums.  Regular physical activity.  Eating a healthy diet.  Avoiding tobacco and drug use.  Limiting alcohol use.  Practicing safe sex.  Taking low doses of aspirin every day.  Taking vitamin and mineral supplements as recommended by your health care provider. What happens during an annual well check? The services and screenings done by your health care provider during your annual well check will depend on your age, overall health, lifestyle risk factors, and family history of disease. Counseling  Your health care provider may ask you questions about  your:  Alcohol use.  Tobacco use.  Drug use.  Emotional well-being.  Home and relationship well-being.  Sexual activity.  Eating habits.  History of falls.  Memory and ability to understand (cognition).  Work and work Statistician. Screening  You may have the following tests or measurements:  Height, weight, and BMI.  Blood pressure.  Lipid and cholesterol levels. These may be checked every 5 years, or more frequently if you are over 36 years old.  Skin check.  Lung cancer screening. You may have this screening every year starting at age 66 if you have a 30-pack-year history of smoking and currently smoke or have quit within the past 15 years.  Fecal occult blood test (FOBT) of the stool. You may have this test every year starting at age 35.  Flexible sigmoidoscopy or colonoscopy. You may have a sigmoidoscopy every 5 years or a colonoscopy every 10 years starting at age 20.  Prostate cancer screening. Recommendations will vary depending on your family history and other risks.  Hepatitis C blood test.  Hepatitis B blood test.  Sexually transmitted disease (STD) testing.  Diabetes screening. This is done by checking your blood sugar (glucose) after you have not eaten for a while (fasting). You may have this done every 1-3 years.  Abdominal aortic aneurysm (AAA) screening. You may need this if you are a current or former smoker.  Osteoporosis. You may be screened starting at age 11 if you are at high risk. Talk with your health care provider about your test results, treatment options, and if necessary, the need for more tests. Vaccines  Your health care provider may recommend certain vaccines, such as:  Influenza  vaccine. This is recommended every year.  Tetanus, diphtheria, and acellular pertussis (Tdap, Td) vaccine. You may need a Td booster every 10 years.  Zoster vaccine. You may need this after age 25.  Pneumococcal 13-valent conjugate (PCV13) vaccine.  One dose is recommended after age 76.  Pneumococcal polysaccharide (PPSV23) vaccine. One dose is recommended after age 43. Talk to your health care provider about which screenings and vaccines you need and how often you need them. This information is not intended to replace advice given to you by your health care provider. Make sure you discuss any questions you have with your health care provider. Document Released: 11/15/2015 Document Revised: 07/08/2016 Document Reviewed: 08/20/2015 Elsevier Interactive Patient Education  2017 [place] Prevention in the Home Falls can cause injuries. They can happen to people of all ages. There are many things you can do to make your home safe and to help prevent falls. What can I do on the outside of my home?  Regularly fix the edges of walkways and driveways and fix any cracks.  Remove anything that might make you trip as you walk through a door, such as a raised step or threshold.  Trim any bushes or trees on the path to your home.  Use bright outdoor lighting.  Clear any walking paths of anything that might make someone trip, such as rocks or tools.  Regularly check to see if handrails are loose or broken. Make sure that both sides of any steps have handrails.  Any raised decks and porches should have guardrails on the edges.  Have any leaves, snow, or ice cleared regularly.  Use sand or salt on walking paths during winter.  Clean up any spills in your garage right away. This includes oil or grease spills. What can I do in the bathroom?  Use night lights.  Install grab bars by the toilet and in the tub and shower. Do not use towel bars as grab bars.  Use non-skid mats or decals in the tub or shower.  If you need to sit down in the shower, use a plastic, non-slip stool.  Keep the floor dry. Clean up any water that spills on the floor as soon as it happens.  Remove soap buildup in the tub or shower regularly.  Attach bath  mats securely with double-sided non-slip rug tape.  Do not have throw rugs and other things on the floor that can make you trip. What can I do in the bedroom?  Use night lights.  Make sure that you have a light by your bed that is easy to reach.  Do not use any sheets or blankets that are too big for your bed. They should not hang down onto the floor.  Have a firm chair that has side arms. You can use this for support while you get dressed.  Do not have throw rugs and other things on the floor that can make you trip. What can I do in the kitchen?  Clean up any spills right away.  Avoid walking on wet floors.  Keep items that you use a lot in easy-to-reach places.  If you need to reach something above you, use a strong step stool that has a grab bar.  Keep electrical cords out of the way.  Do not use floor polish or wax that makes floors slippery. If you must use wax, use non-skid floor wax.  Do not have throw rugs and other things on the floor that can  make you trip. What can I do with my stairs?  Do not leave any items on the stairs.  Make sure that there are handrails on both sides of the stairs and use them. Fix handrails that are broken or loose. Make sure that handrails are as long as the stairways.  Check any carpeting to make sure that it is firmly attached to the stairs. Fix any carpet that is loose or worn.  Avoid having throw rugs at the top or bottom of the stairs. If you do have throw rugs, attach them to the floor with carpet tape.  Make sure that you have a light switch at the top of the stairs and the bottom of the stairs. If you do not have them, ask someone to add them for you. What else can I do to help prevent falls?  Wear shoes that:  Do not have high heels.  Have rubber bottoms.  Are comfortable and fit you well.  Are closed at the toe. Do not wear sandals.  If you use a stepladder:  Make sure that it is fully opened. Do not climb a closed  stepladder.  Make sure that both sides of the stepladder are locked into place.  Ask someone to hold it for you, if possible.  Clearly mark and make sure that you can see:  Any grab bars or handrails.  First and last steps.  Where the edge of each step is.  Use tools that help you move around (mobility aids) if they are needed. These include:  Canes.  Walkers.  Scooters.  Crutches.  Turn on the lights when you go into a dark area. Replace any light bulbs as soon as they burn out.  Set up your furniture so you have a clear path. Avoid moving your furniture around.  If any of your floors are uneven, fix them.  If there are any pets around you, be aware of where they are.  Review your medicines with your doctor. Some medicines can make you feel dizzy. This can increase your chance of falling. Ask your doctor what other things that you can do to help prevent falls. This information is not intended to replace advice given to you by your health care provider. Make sure you discuss any questions you have with your health care provider. Document Released: 08/15/2009 Document Revised: 03/26/2016 Document Reviewed: 11/23/2014 Elsevier Interactive Patient Education  2017 [employer].

## 2017-08-19 NOTE — Progress Notes (Signed)
Subjective:   Daniel Ramirez is a 66 y.o. male who presents for an Initial Medicare Annual Wellness Visit.  Review of Systems  N/A Cardiac Risk Factors include: advanced age (>25men, >57 women);diabetes mellitus;dyslipidemia;hypertension;obesity (BMI >30kg/m2)    Objective:    Today's Vitals   08/19/17 0914  BP: 124/82  Pulse: 84  Weight: 240 lb 6 oz (109 kg)  Height: 6\' 1"  (1.854 m)   Body mass index is 31.71 kg/m.  Current Medications (verified) Outpatient Encounter Prescriptions as of 08/19/2017  Medication Sig  . aspirin 81 MG tablet Take 81 mg by mouth daily.  . cetirizine (ZYRTEC) 10 MG tablet Take 10 mg by mouth daily.  . metFORMIN (GLUCOPHAGE) 500 MG tablet Take 1 tablet (500 mg total) by mouth 2 (two) times daily with a meal.  . Multiple Vitamin (MULTIVITAMIN) tablet Take 1 tablet by mouth daily.  Marland Kitchen olmesartan (BENICAR) 20 MG tablet TAKE 1/2 (ONE-HALF) TABLET BY MOUTH DAILY.  . pravastatin (PRAVACHOL) 40 MG tablet Take 1 tablet (40 mg total) by mouth daily.  . [DISCONTINUED] azelastine (ASTELIN) 0.1 % nasal spray Place 2 sprays into both nostrils 2 (two) times daily. Use in each nostril as directed (Patient not taking: Reported on 04/27/2017)  . [DISCONTINUED] Guaifenesin (MUCINEX MAXIMUM STRENGTH) 1200 MG TB12 Take 1 tablet (1,200 mg total) by mouth every 12 (twelve) hours as needed. (Patient not taking: Reported on 04/27/2017)   No facility-administered encounter medications on file as of 08/19/2017.     Allergies (verified) Patient has no known allergies.   History: Past Medical History:  Diagnosis Date  . Allergy   . Arthritis   . Cardiac arrhythmia   . Cataract   . Cataract, left eye   . Detached retina   . Diverticulosis   . Gallstones   . HTN (hypertension)   . Internal hemorrhoid   . Pneumonia    Past Surgical History:  Procedure Laterality Date  . CATARACT EXTRACTION Right   . CHOLECYSTECTOMY  2011  . KNEE ARTHROSCOPY Right   . KNEE CARTILAGE  SURGERY Right    x 2  . RETINAL DETACHMENT SURGERY Right    x 2  . TONSILLECTOMY    . YAG LASER APPLICATION Right    Family History  Problem Relation Age of Onset  . Heart disease Father        MI  . Rheum arthritis Mother    Social History   Occupational History  . Vending/self employed    Social History Main Topics  . Smoking status: Former Smoker    Packs/day: 1.00    Years: 0.80    Types: Cigarettes    Quit date: 01/31/1969  . Smokeless tobacco: Never Used  . Alcohol use No  . Drug use: No  . Sexual activity: Yes    Partners: Female    Birth control/ protection: None   Tobacco Counseling Counseling given: Not Answered   Activities of Daily Living In your present state of health, do you have any difficulty performing the following activities: 08/19/2017 04/27/2017  Hearing? N N  Vision? N N  Difficulty concentrating or making decisions? N N  Walking or climbing stairs? Y Y  Comment Patient has had 3 knee surgeries Sometimes, per pt bad knees  Dressing or bathing? N N  Doing errands, shopping? N N  Preparing Food and eating ? N -  Using the Toilet? N -  In the past six months, have you accidently leaked urine? N -  Do you have problems with loss of bowel control? N -  Managing your Medications? N -  Managing your Finances? N -  Housekeeping or managing your Housekeeping? N -  Some recent data might be hidden    Immunizations and Health Maintenance Immunization History  Administered Date(s) Administered  . Pneumococcal Conjugate-13 09/19/2015  . Pneumococcal Polysaccharide-23 05/20/2010, 10/06/2016  . Tdap 02/07/2008   There are no preventive care reminders to display for this patient.  Patient Care Team: Harrison Mons, PA-C as PCP - General (Family Medicine) Druscilla Brownie, MD as Consulting Physician (Dermatology) Erik Obey, DMD (Dentistry)  Indicate any recent Medical Services you may have received from other than [facility] providers in the  past year (date may be approximate).    Assessment:   This is a routine wellness examination for Daniel Ramirez.   Hearing/Vision screen Vision Screening Comments: Patient sees Dr. Ellie Lunch for his routine eye exams every year.   Dietary issues and exercise activities discussed: Current Exercise Habits: The patient does not participate in regular exercise at present (stays active on his job ), Exercise limited by: None identified  Goals    . declined           Patient declined healthcare goal at this time.       Depression Screen PHQ 2/9 Scores 08/19/2017 04/27/2017 03/11/2017 01/26/2017  PHQ - 2 Score 0 0 0 0    Fall Risk Fall Risk  08/19/2017 04/27/2017 01/26/2017 01/22/2017 01/19/2017  Falls in the past year? No No No No No  Number falls in past yr: - - - - -  Injury with Fall? - - - - -    Cognitive Function:     6CIT Screen 08/19/2017  What Year? 0 points  What month? 0 points  What time? 0 points  Count back from 20 0 points  Months in reverse 0 points  Repeat phrase 0 points  Total Score 0    Screening Tests Health Maintenance  Topic Date Due  . INFLUENZA VACCINE  08/20/2027 (Originally 06/02/2017)  . HEMOGLOBIN A1C  10/27/2017  . FOOT EXAM  01/19/2018  . TETANUS/TDAP  02/06/2018  . OPHTHALMOLOGY EXAM  03/09/2018  . COLONOSCOPY  11/24/2023  . Hepatitis C Screening  Completed  . PNA vac Low Risk Adult  Completed        Plan:   I have personally reviewed and noted the following in the patient's chart:   . Medical and social history . Use of alcohol, tobacco or illicit drugs  . Current medications and supplements . Functional ability and status . Nutritional status . Physical activity . Advanced directives . List of other physicians . Hospitalizations, surgeries, and ER visits in previous 12 months . Vitals . Screenings to include cognitive, depression, and falls . Referrals and appointments  In addition, I have reviewed and discussed with patient certain  preventive protocols, quality metrics, and best practice recommendations. A written personalized care plan for preventive services as well as general preventive health recommendations were provided to patient.  Patient declined flu vaccine Patient wants to do blood work at visit with his PCP next week.    Daniel Ramirez, [place], LPN   [id number]

## 2017-08-24 ENCOUNTER — Encounter: Payer: Self-pay | Admitting: Physician Assistant

## 2017-08-24 ENCOUNTER — Ambulatory Visit (INDEPENDENT_AMBULATORY_CARE_PROVIDER_SITE_OTHER): Payer: Medicare Other | Admitting: Physician Assistant

## 2017-08-24 VITALS — BP 122/84 | HR 85 | Temp 97.7°F | Resp 18 | Ht 73.0 in | Wt 241.0 lb

## 2017-08-24 DIAGNOSIS — E785 Hyperlipidemia, unspecified: Secondary | ICD-10-CM

## 2017-08-24 DIAGNOSIS — I10 Essential (primary) hypertension: Secondary | ICD-10-CM

## 2017-08-24 DIAGNOSIS — E119 Type 2 diabetes mellitus without complications: Secondary | ICD-10-CM

## 2017-08-24 NOTE — Progress Notes (Signed)
Subjective:    Patient ID: Daniel Ramirez, male    DOB: 26-May-1951, 66 y.o.   MRN: [id number]  HPI  Daniel Ramirez is a 66 year old Caucasian male with a past medical history significant for hypertension, hyperlipidemia, Type 2 diabetes mellitus who presents today for follow-up of hypertension, hyperlipidemia, and Type 2 diabetes mellitus. Patient reports he is compliant with medications and tolerating them well without adverse effects.   Daniel Ramirez last A1c was 6.1% in June 2018. He reports he is not checking blood glucose level at home and does not have any interest. He denies changes in his vision. His last optometry appointment was in June 2018. Daniel Ramirez denies dizziness. He also changes to sensation in hands and feet. Daniel Ramirez states he performs feet checks daily. Daniel Ramirez denies changes in urinary frequency and hematuria.  Daniel Ramirez reports he is not checking his blood pressure at home. He denies headaches, chest pain, dyspnea, orthopnea, PND and peripheral edema. Daniel Ramirez states he tries to eat decently, but it is difficult when he is working 60 hours per week. Daniel Ramirez states he works 1-2 well-balanced meals per day. He has to grab and go for a lot of his meals. Daniel Ramirez denies alcohol use, tobacco use or illicit drug use.   Medications:  Aspirin 81mg  daily  Metformin 500mg  BID Olmesartan 10mg  daily  Pravastatin 40mg  daily   Allergies:  No known drug allergies   Chronic Medical Conditions:  Hypertension Hyperlipidemia Arthritis Type 2 Diabetes Mellitus Right Bundle Branch Block Patent Foramen [place]vale  Review of Systems     Objective:   Physical Exam  Constitutional: He appears well-developed and well-nourished. He is cooperative.  BP 122/84 (BP Location: Left Arm, Patient Position: Sitting, Cuff Size: Normal)   Pulse 85   Temp 97.7 F (36.5 C) (Oral)   Resp 18   Ht 6\' 1"  (1.854 m)   Wt 241 lb (109.3 kg)   SpO2 98%   BMI 31.80 kg/m    HENT:  Head: Normocephalic and  atraumatic.  Eyes: Pupils are equal, round, and reactive to light. Conjunctivae are normal.  Neck: Normal range of motion. Neck supple. No thyromegaly present.  Cardiovascular: Normal rate, regular rhythm, normal heart sounds and intact distal pulses.   No murmur heard. Pulmonary/Chest: Effort normal and breath sounds normal. He has no wheezes.  Lymphadenopathy:    He has no cervical adenopathy.  Neurological: He is alert.      Assessment & Plan:  1. Type 2 diabetes mellitus  - Hemoglobin A1c obtained today in clinic - CMP obtained today in clinic - Microalbumin obtained today in clinic  - Continue Metformin 500mg  daily  - Encouraged patient to continue to attempt to eat well-balanced meals. Recommended patient eats more fruits and vegetables, lean proteins, and low sodium diet. Encouraged patient to continue to get at least 30 minutes of exercise 5 times per week. - Follow-up in 3 months for re-check of Type 2 diabetes mellitus  2. Essential hypertension - CBC with diff obtained today in clinic - TSH obtained today in clinic - Continue Olmesartan 10mg  daily - Encouraged patient to continue to attempt to eat well-balanced meals. Recommended patient eats more fruits and vegetables, lean proteins, and low sodium diet. Encouraged patient to continue to get at least 30 minutes of exercise 5 times per week. - Follow up in 3 months for re-check of hypertension  3. Hyperlipidemia, unspecified hyperlipidemia type - Lipid panel obtained today in  clinic  - Continue Pravastatin 20mg  daily - Encouraged patient to continue to attempt to eat well-balanced meals. Recommended patient eats more fruits and vegetables, lean proteins, and low sodium diet. Encouraged patient to continue to get at least 30 minutes of exercise 5 times per week. - Follow-up in 3 months for re-check of hyperlipidemia  Miamor Ayler, PA-S

## 2017-08-24 NOTE — Progress Notes (Signed)
Patient ID: Daniel Ramirez, male    DOB: [birth year], 66 y.o.   MRN: [id number]  PCP: Daniel Mons, PA-C  Chief Complaint  Patient presents with  . Diabetes  . Hypertension  . Hyperlipidemia  . Follow-up    Subjective:   Presents for evaluation of diabetes, HTN and hyperlipidemia.  Diabetes has been well controlled, with A1C <6.5% for the past year. LDL has been at goal as well.  He feels good in general, and has no specific concerns. Tolerating medications without issues.  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   Review of Systems  Constitutional: Negative for activity change, appetite change, fatigue and unexpected weight change.  HENT: Negative for congestion, dental problem, ear pain, hearing loss, mouth sores, postnasal drip, rhinorrhea, sneezing, sore throat, tinnitus and trouble swallowing.   Eyes: Negative for photophobia, pain, redness and visual disturbance.  Respiratory: Negative for cough, chest tightness and shortness of breath.   Cardiovascular: Negative for chest pain, palpitations and leg swelling.  Gastrointestinal: Negative for abdominal pain, blood in stool, constipation, diarrhea, nausea and vomiting.  Endocrine: Negative for cold intolerance, heat intolerance, polydipsia, polyphagia and polyuria.  Genitourinary: Negative for dysuria, frequency, hematuria and urgency.  Musculoskeletal: Negative for arthralgias, gait problem, myalgias and neck stiffness.  Skin: Negative for rash.  Neurological: Negative for dizziness, speech difficulty, weakness, light-headedness, numbness and headaches.  Hematological: Negative for adenopathy.  Psychiatric/Behavioral: Negative for confusion and sleep disturbance. The patient is not nervous/anxious.        Patient Active Problem List   Diagnosis Date Noted  . Diabetes ([place]) 05/07/2015  . BMI 31.0-31.9,adult 08/28/2014  . PFO (patent foramen ovale)  08/02/2012  . Hypertension 04/12/2012  . Hyperlipidemia 04/12/2012  . RBBB 04/12/2012  . Arthritis 04/12/2012     Prior to Admission medications   Medication Sig Start Date End Date Taking? Authorizing Provider  aspirin 81 MG tablet Take 81 mg by mouth daily.   Yes [provider]  cetirizine (ZYRTEC) 10 MG tablet Take 10 mg by mouth daily.   Yes [provider]  metFORMIN (GLUCOPHAGE) 500 MG tablet Take 1 tablet (500 mg total) by mouth 2 (two) times daily with a meal. 10/06/16  Yes Shyheem Whitham, PA-C  Multiple Vitamin (MULTIVITAMIN) tablet Take 1 tablet by mouth daily.   Yes [provider]  olmesartan (BENICAR) 20 MG tablet TAKE 1/2 (ONE-HALF) TABLET BY MOUTH DAILY. 10/06/16  Yes Desree Leap, PA-C  pravastatin (PRAVACHOL) 40 MG tablet Take 1 tablet (40 mg total) by mouth daily. 04/27/17  Yes Cassidie Veiga, PA-C     No Known Allergies     Objective:  Physical Exam  Constitutional: He is oriented to person, place, and time. He appears well-developed and well-nourished. He is active and cooperative. No distress.  BP 122/84 (BP Location: Left Arm, Patient Position: Sitting, Cuff Size: Normal)   Pulse 85   Temp 97.7 F (36.5 C) (Oral)   Resp 18   Ht 6\' 1"  (1.854 m)   Wt 241 lb (109.3 kg)   SpO2 98%   BMI 31.80 kg/m   HENT:  Head: Normocephalic and atraumatic.  Right Ear: Hearing normal.  Left Ear: Hearing normal.  Eyes: Conjunctivae are normal. No scleral icterus.  Neck: Normal range of motion. Neck supple. No thyromegaly present.  Cardiovascular: Normal rate, regular rhythm and normal heart sounds.   Pulses:  Radial pulses are 2+ on the right side, and 2+ on the left side.  Pulmonary/Chest: Effort normal and breath sounds normal.  Lymphadenopathy:       Head (right side): No tonsillar, no preauricular, no posterior auricular and no occipital adenopathy present.       Head (left side): No tonsillar, no preauricular, no posterior  auricular and no occipital adenopathy present.    He has no cervical adenopathy.       Right: No supraclavicular adenopathy present.       Left: No supraclavicular adenopathy present.  Neurological: He is alert and oriented to person, place, and time. No sensory deficit.  Skin: Skin is warm, dry and intact. No rash noted. No cyanosis or erythema. Nails show no clubbing.  Psychiatric: He has a normal mood and affect. His speech is normal and behavior is normal.           Assessment & Plan:   Problem List Items Addressed This Visit    Hypertension    Controlled. Continue current regimen.      Relevant Orders   CBC with Differential/Platelet   Comprehensive metabolic panel   TSH   Hyperlipidemia    Continue healthy lifestyle changes. Continue current treatment. Adjust regimen as indicated by lab results.      Relevant Orders   Comprehensive metabolic panel   Lipid panel   Diabetes ([place]) - Primary    Anticipate good control, given recent labs. Update today. Continue lifestyle changes and current medications pending today's labs.      Relevant Orders   Comprehensive metabolic panel   Hemoglobin A1c       Return in about 3 months (around 11/24/2017) for re-evalaution of diabetes, HTN, cholesterol.   Fara Chute, PA-C [facility] [place]

## 2017-08-24 NOTE — Assessment & Plan Note (Signed)
Anticipate good control, given recent labs. Update today. Continue lifestyle changes and current medications pending today's labs.

## 2017-08-24 NOTE — Assessment & Plan Note (Signed)
Controlled. Continue current regimen. 

## 2017-08-24 NOTE — Patient Instructions (Addendum)
Keep trying to improve your eating to include healthier choices. Consider groceries that have salad bars, hot bars. Bring healthy snacks in a cooler with you for snacking.    IF you received an x-ray today, you will receive an invoice from [facility]. Please contact [facility] at [phone] with questions or concerns regarding your invoice.   IF you received labwork today, you will receive an invoice from [facility]. Please contact [facility] at [phone] with questions or concerns regarding your invoice.   Our billing staff will not be able to assist you with questions regarding bills from these companies.  You will be contacted with the lab results as soon as they are available. The fastest way to get your results is to activate your My Chart account. Instructions are located on the last page of this paperwork. If you have not heard from [place] regarding the results in 2 weeks, please contact this office.

## 2017-08-24 NOTE — Assessment & Plan Note (Signed)
Continue healthy lifestyle changes. Continue current treatment. Adjust regimen as indicated by lab results.

## 2017-08-25 LAB — COMPREHENSIVE METABOLIC PANEL
A/G RATIO: 1.7 (ref 1.2–2.2)
ALT: 20 IU/L (ref 0–44)
AST: 18 IU/L (ref 0–40)
Albumin: 4.7 g/dL (ref 3.6–4.8)
Alkaline Phosphatase: 68 IU/L (ref 39–117)
BILIRUBIN TOTAL: 0.5 mg/dL (ref 0.0–1.2)
BUN/Creatinine Ratio: 16 (ref 10–24)
BUN: 18 mg/dL (ref 8–27)
CALCIUM: 10.2 mg/dL (ref 8.6–10.2)
CHLORIDE: 99 mmol/L (ref 96–106)
CO2: 23 mmol/L (ref 20–29)
Creatinine, Ser: 1.14 mg/dL (ref 0.76–1.27)
GFR calc Af Amer: 77 mL/min/{1.73_m2} (ref >59)
GFR calc non Af Amer: 67 mL/min/{1.73_m2} (ref >59)
Globulin, Total: 2.8 g/dL (ref 1.5–4.5)
Glucose: 112 mg/dL — ABNORMAL HIGH (ref 65–99)
POTASSIUM: 4.7 mmol/L (ref 3.5–5.2)
Sodium: 139 mmol/L (ref 134–144)
Total Protein: 7.5 g/dL (ref 6.0–8.5)

## 2017-08-25 LAB — CBC WITH DIFFERENTIAL/PLATELET
BASOS ABS: 0 10*3/uL (ref 0.0–0.2)
BASOS: 0 %
EOS (ABSOLUTE): 0.2 10*3/uL (ref 0.0–0.4)
Eos: 2 %
Hematocrit: 43 % (ref 37.5–51.0)
Hemoglobin: 14.8 g/dL (ref 13.0–17.7)
IMMATURE GRANULOCYTES: 0 %
Immature Grans (Abs): 0 10*3/uL (ref 0.0–0.1)
Lymphocytes Absolute: 2.7 10*3/uL (ref 0.7–3.1)
Lymphs: 34 %
MCH: 32 pg (ref 26.6–33.0)
MCHC: 34.4 g/dL (ref 31.5–35.7)
MCV: 93 fL (ref 79–97)
MONOS ABS: 0.5 10*3/uL (ref 0.1–0.9)
Monocytes: 6 %
NEUTROS PCT: 58 %
Neutrophils Absolute: 4.5 10*3/uL (ref 1.4–7.0)
PLATELETS: 264 10*3/uL (ref 150–379)
RBC: 4.63 x10E6/uL (ref 4.14–5.80)
RDW: 13.7 % (ref 12.3–15.4)
WBC: 7.9 10*3/uL (ref 3.4–10.8)

## 2017-08-25 LAB — HEMOGLOBIN A1C
ESTIMATED AVERAGE GLUCOSE: 131 mg/dL
Hgb A1c MFr Bld: 6.2 % — ABNORMAL HIGH (ref 4.8–5.6)

## 2017-08-25 LAB — LIPID PANEL
CHOLESTEROL TOTAL: 140 mg/dL (ref 100–199)
Chol/HDL Ratio: 3.3 ratio (ref 0.0–5.0)
HDL: 42 mg/dL (ref >39)
LDL Calculated: 71 mg/dL (ref 0–99)
TRIGLYCERIDES: 137 mg/dL (ref 0–149)
VLDL CHOLESTEROL CAL: 27 mg/dL (ref 5–40)

## 2017-08-25 LAB — TSH: TSH: 1.66 u[IU]/mL (ref 0.450–4.500)

## 2017-09-13 ENCOUNTER — Other Ambulatory Visit: Payer: Self-pay | Admitting: Physician Assistant

## 2017-09-13 DIAGNOSIS — E785 Hyperlipidemia, unspecified: Secondary | ICD-10-CM

## 2017-09-13 DIAGNOSIS — E119 Type 2 diabetes mellitus without complications: Secondary | ICD-10-CM

## 2017-10-21 ENCOUNTER — Encounter: Payer: Self-pay | Admitting: Physician Assistant

## 2017-10-21 ENCOUNTER — Other Ambulatory Visit: Payer: Self-pay

## 2017-10-21 ENCOUNTER — Ambulatory Visit (INDEPENDENT_AMBULATORY_CARE_PROVIDER_SITE_OTHER): Payer: Medicare Other | Admitting: Physician Assistant

## 2017-10-21 VITALS — BP 110/64 | HR 98 | Temp 98.7°F | Resp 18 | Ht 72.05 in | Wt 235.2 lb

## 2017-10-21 DIAGNOSIS — R05 Cough: Secondary | ICD-10-CM

## 2017-10-21 DIAGNOSIS — J111 Influenza due to unidentified influenza virus with other respiratory manifestations: Secondary | ICD-10-CM | POA: Diagnosis not present

## 2017-10-21 DIAGNOSIS — R059 Cough, unspecified: Secondary | ICD-10-CM

## 2017-10-21 LAB — POC INFLUENZA A&B (BINAX/QUICKVUE)
INFLUENZA A, POC: POSITIVE — AB
Influenza B, POC: NEGATIVE

## 2017-10-21 MED ORDER — GUAIFENESIN ER 1200 MG PO TB12: 1.0000 | ORAL_TABLET | Freq: Two times a day (BID) | ORAL | 1 refills | Status: DC | PRN

## 2017-10-21 MED ORDER — OSELTAMIVIR PHOSPHATE 75 MG PO CAPS: 75.0000 mg | ORAL_CAPSULE | Freq: Two times a day (BID) | ORAL | 0 refills | Status: DC

## 2017-10-21 MED ORDER — BENZONATATE 100 MG PO CAPS: 100.0000 mg | ORAL_CAPSULE | Freq: Three times a day (TID) | ORAL | 0 refills | Status: DC | PRN

## 2017-10-21 NOTE — Patient Instructions (Addendum)
You have tested positive for the flu, you are contagious until you are fever free for 24 hours without using tylenol or ibuprofen.Please stay out of work until you are no longer contagious. Two major complications after the flu are pneumonia and sinus infections. Please be aware of this and if you are not any better in 7-10 days or you develop worsening cough or sinus pressure, seek care at our clinic or the ED. Continue to wash your hands and wear a mask daily especially around other people.    For your cough, I have given you both Tessalon Perles and Mucinex.  When you feel you want to bring things up use Mucinex.  When you want to suppress her cough use Tessalon Perles.  Thank you for letting me participate in your health and well being.  Influenza, Adult Influenza ("the flu") is an infection in the lungs, nose, and throat (respiratory tract). It is caused by a virus. The flu causes many common cold symptoms, as well as a high fever and body aches. It can make you feel very sick. The flu spreads easily from person to person (is contagious). Getting a flu shot (influenza vaccination) every year is the best way to prevent the flu. Follow these instructions at home:  Take over-the-counter and prescription medicines only as told by your doctor.  Use a cool mist humidifier to add moisture (humidity) to the air in your home. This can make it easier to breathe.  Rest as needed.  Drink enough fluid to keep your pee (urine) clear or pale yellow.  Cover your mouth and nose when you cough or sneeze.  Wash your hands with soap and water often, especially after you cough or sneeze. If you cannot use soap and water, use hand sanitizer.  Stay home from work or school as told by your doctor. Unless you are visiting your doctor, try to avoid leaving home until your fever has been gone for 24 hours without the use of medicine.  Keep all follow-up visits as told by your doctor. This is important. How is  this prevented?  Getting a yearly (annual) flu shot is the best way to avoid getting the flu. You may get the flu shot in late summer, fall, or winter. Ask your doctor when you should get your flu shot.  Wash your hands often or use hand sanitizer often.  Avoid contact with people who are sick during cold and flu season.  Eat healthy foods.  Drink plenty of fluids.  Get enough sleep.  Exercise regularly. Contact a doctor if:  You get new symptoms.  You have: ? Chest pain. ? Watery poop (diarrhea). ? A fever.  Your cough gets worse.  You start to have more mucus.  You feel sick to your stomach (nauseous).  You throw up (vomit). Get help right away if:  You start to be short of breath or have trouble breathing.  Your skin or nails turn a bluish color.  You have very bad pain or stiffness in your neck.  You get a sudden headache.  You get sudden pain in your face or ear.  You cannot stop throwing up. This information is not intended to replace advice given to you by your health care provider. Make sure you discuss any questions you have with your health care provider. Document Released: 07/28/2008 Document Revised: 03/26/2016 Document Reviewed: 08/13/2015 Elsevier Interactive Patient Education  2017 [employer].    IF you received an x-ray today, you will  receive an Pharmacologist from [facility]. Please contact [facility] at [phone] with questions or concerns regarding your invoice.   IF you received labwork today, you will receive an invoice from [facility]. Please contact [facility] at [phone] with questions or concerns regarding your invoice.   Our billing staff will not be able to assist you with questions regarding bills from these companies.  You will be contacted with the lab results as soon as they are available. The fastest way to get your results is to activate your My Chart account. Instructions are located on the last page of this  paperwork. If you have not heard from [place] regarding the results in 2 weeks, please contact this office.

## 2017-10-21 NOTE — Progress Notes (Signed)
MRN: [id number] DOB: [birth year]  Subjective:   Daniel Ramirez is a 66 y.o. male presenting for chief complaint of Cough (X 4-5 days) and Fatigue (X 2 days) .  Reports 12 day history of illness. Started out with a head cold, runny nose, sneezing, and congestion. Started feeling better but then developed barking cough, generalized body aches, and fatigue x 2 days. Cough is non productive. Has had subjective fever.  Has tried tylenol with relief. Denies sinus pain, ear pain, sore throat, wheezing, shortness of breath, chest pain, night sweats, nausea, vomiting, abdominal pain and diarrhea. Has had sick contact with wife who thinks she has the flu but has not been seen in office. Has history of seasonal allergies, no history of asthma or COPD. Patient has not flu shot this season. Denies smoking. Denies recent travel. Denies any other aggravating or relieving factors, no other questions or concerns.  Daniel Ramirez has a current medication list which includes the following prescription(s): aspirin, cetirizine, metformin, multivitamin, olmesartan, and pravastatin. Also has No Known Allergies.  Daniel Ramirez  has a past medical history of Allergy, Arthritis, Cardiac arrhythmia, Cataract, Cataract, left eye, Detached retina, Diverticulosis, Gallstones, HTN (hypertension), Internal hemorrhoid, and Pneumonia. Also  has a past surgical history that includes Cholecystectomy (2011); Retinal detachment surgery (Right); Cataract extraction (Right); Yag laser application (Right); Knee cartilage surgery (Right); Knee arthroscopy (Right); and Tonsillectomy.   Objective:   Vitals: BP 110/64 (BP Location: Left Arm, Patient Position: Sitting, Cuff Size: Large)   Pulse 98   Temp 98.7 F (37.1 C) (Oral)   Resp 18   Ht 6' 0.05" (1.83 m)   Wt 235 lb 3.2 oz (106.7 kg)   SpO2 99%   BMI 31.86 kg/m   Physical Exam  Constitutional: He is oriented to person, place, and time. He appears well-developed and well-nourished. He appears  distressed (coughing during exam, appears like he does not feel well).  HENT:  Head: Normocephalic and atraumatic.  Right Ear: External ear and ear canal normal. Tympanic membrane is not erythematous and not bulging. A middle ear effusion is present.  Left Ear: External ear and ear canal normal. Tympanic membrane is not erythematous and not bulging. A middle ear effusion is present.  Nose: Mucosal edema (mild in right, moderate in left) and rhinorrhea present. Right sinus exhibits no maxillary sinus tenderness and no frontal sinus tenderness. Left sinus exhibits no maxillary sinus tenderness and no frontal sinus tenderness.  Mouth/Throat: Uvula is midline, oropharynx is clear and moist and mucous membranes are normal. No tonsillar exudate.  Eyes: Right conjunctiva is injected. Left conjunctiva is injected.  Neck: Normal range of motion.  Cardiovascular: Normal rate, regular rhythm and normal heart sounds.  Pulmonary/Chest: Effort normal. He has no wheezes. He has no rales.  Lymphadenopathy:       Head (right side): No submental, no submandibular, no tonsillar, no preauricular, no posterior auricular and no occipital adenopathy present.       Head (left side): No submental, no submandibular, no tonsillar, no preauricular, no posterior auricular and no occipital adenopathy present.    He has no cervical adenopathy.       Right: No supraclavicular adenopathy present.       Left: No supraclavicular adenopathy present.  Neurological: He is alert and oriented to person, place, and time.  Skin: Skin is warm and dry.  Psychiatric: He has a normal mood and affect.  Vitals reviewed.   Results for orders placed or performed in visit on  10/21/17 (from the past 24 hour(s))  POC Influenza A&B(BINAX/QUICKVUE)     Status: Abnormal   Collection Time: 10/21/17 11:00 AM  Result Value Ref Range   Influenza A, POC Positive (A) Negative   Influenza B, POC Negative Negative    Assessment and Plan :  1.  Cough - POC Influenza A&B(BINAX/QUICKVUE) - Guaifenesin (MUCINEX MAXIMUM STRENGTH) 1200 MG TB12; Take 1 tablet (1,200 mg total) by mouth every 12 (twelve) hours as needed.  Dispense: 14 tablet; Refill: 1 - benzonatate (TESSALON) 100 MG capsule; Take 1-2 capsules (100-200 mg total) by mouth 3 (three) times daily as needed for cough.  Dispense: 40 capsule; Refill: 0 2. Influenza POCT positive for influenza A. Will treat accordingly. Given symptomatic medication as well. Pt educated on potential complications of the flu. Vitals stables. Lungs are CTAB.  Educated to rest. Continue tylenol. Advised to return to clinic if symptoms worsen, do not improve, or as needed. - oseltamivir (TAMIFLU) 75 MG capsule; Take 1 capsule (75 mg total) by mouth 2 (two) times daily.  Dispense: 10 capsule; Refill: 0  Tenna Delaine, PA-C  [facility] at [place] 10/21/2017 3:01 PM

## 2017-11-30 ENCOUNTER — Encounter: Payer: Self-pay | Admitting: Physician Assistant

## 2017-11-30 ENCOUNTER — Ambulatory Visit (INDEPENDENT_AMBULATORY_CARE_PROVIDER_SITE_OTHER): Payer: Medicare Other | Admitting: Physician Assistant

## 2017-11-30 VITALS — BP 124/60 | HR 95 | Temp 98.1°F | Resp 18 | Ht 72.5 in | Wt 237.0 lb

## 2017-11-30 DIAGNOSIS — I10 Essential (primary) hypertension: Secondary | ICD-10-CM

## 2017-11-30 DIAGNOSIS — E785 Hyperlipidemia, unspecified: Secondary | ICD-10-CM

## 2017-11-30 DIAGNOSIS — E119 Type 2 diabetes mellitus without complications: Secondary | ICD-10-CM | POA: Diagnosis not present

## 2017-11-30 MED ORDER — OLMESARTAN MEDOXOMIL 20 MG PO TABS: ORAL_TABLET | ORAL | 3 refills | Status: DC

## 2017-11-30 NOTE — Assessment & Plan Note (Signed)
Await labs. Adjust regimen as indicated by results.  

## 2017-11-30 NOTE — Progress Notes (Signed)
Patient ID: Daniel Ramirez, male    DOB: [birth year], 67 y.o.   MRN: [id number]  PCP: Harrison Mons, PA-C  Chief Complaint  Patient presents with  . Diabetes  . Hypertension  . Hyperlipidemia  . Follow-up    Subjective:   Presents for evaluation of diabetes, HTN, hyperlipidemia.  Is nearly back to his usual state of health following a respiratory illness. Still some fatigue, but respiratory symptoms and body aches have resolved. He was seen here by one of my colleagues 10/21/2017, having had a cold, then worsening symptoms for 2 days.  He tested positive for influenza A. He had not received the seasonal flu vaccine.  Does not check home glucose. Occasionally checks home BP.  Tolerating his current medications well. Sees dermatology next week to remove a lesion below the lower lip that did not resolve with electrocautery. Knee OA pain with the recent cold weather.   Review of Systems  Constitutional: Negative for activity change, appetite change, fatigue and unexpected weight change.  HENT: Negative for congestion, dental problem, ear pain, hearing loss, mouth sores, postnasal drip, rhinorrhea, sneezing, sore throat, tinnitus and trouble swallowing.   Eyes: Negative for photophobia, pain, redness and visual disturbance.  Respiratory: Negative for cough, chest tightness and shortness of breath.   Cardiovascular: Negative for chest pain, palpitations and leg swelling.  Gastrointestinal: Negative for abdominal pain, blood in stool, constipation, diarrhea, nausea and vomiting.  Endocrine: Negative for cold intolerance, heat intolerance, polydipsia, polyphagia and polyuria.  Genitourinary: Negative for dysuria, frequency, hematuria and urgency.  Musculoskeletal: Negative for arthralgias, gait problem, myalgias and neck stiffness.  Skin: Negative for rash.  Neurological: Negative for dizziness, speech difficulty, weakness, light-headedness, numbness and headaches.    Hematological: Negative for adenopathy.  Psychiatric/Behavioral: Negative for confusion and sleep disturbance. The patient is not nervous/anxious.        Patient Active Problem List   Diagnosis Date Noted  . Diabetes ([place]) 05/07/2015  . BMI 31.0-31.9,adult 08/28/2014  . PFO (patent foramen ovale) 08/02/2012  . Hypertension 04/12/2012  . Hyperlipidemia 04/12/2012  . RBBB 04/12/2012  . Arthritis 04/12/2012     Prior to Admission medications   Medication Sig Start Date End Date Taking? Authorizing Provider  aspirin 81 MG tablet Take 81 mg by mouth daily.   Yes [provider]  cetirizine (ZYRTEC) 10 MG tablet Take 10 mg by mouth daily.   Yes [provider]  metFORMIN (GLUCOPHAGE) 500 MG tablet TAKE 1 TABLET BY MOUTH TWO  TIMES DAILY WITH A MEAL 09/13/17  Yes Bethenny Losee, PA-C  Multiple Vitamin (MULTIVITAMIN) tablet Take 1 tablet by mouth daily.   Yes [provider]  olmesartan (BENICAR) 20 MG tablet TAKE 1/2 (ONE-HALF) TABLET BY MOUTH DAILY. 10/06/16  Yes Clayten Allcock, PA-C  pravastatin (PRAVACHOL) 40 MG tablet TAKE 1 TABLET BY MOUTH  DAILY 09/13/17  Yes Mercadez Heitman, PA-C     No Known Allergies     Objective:  Physical Exam  Constitutional: He is oriented to person, place, and time. He appears well-developed and well-nourished. He is active and cooperative. No distress.  BP 124/60 (BP Location: Left Arm, Patient Position: Sitting, Cuff Size: Large)   Pulse 95   Temp 98.1 F (36.7 C) (Oral)   Resp 18   Ht 6' 0.5" (1.842 m)   Wt 237 lb (107.5 kg)   SpO2 95%   BMI 31.70 kg/m   HENT:  Head: Normocephalic and atraumatic.  Right Ear: Hearing normal.  Left Ear: Hearing normal.  Eyes: Conjunctivae are normal. No scleral icterus.  Neck: Normal range of motion. Neck supple. No thyromegaly present.  Cardiovascular: Normal rate, regular rhythm and normal heart sounds.  Pulses:      Radial pulses are 2+ on the right side, and 2+ on the  left side.  Pulmonary/Chest: Effort normal and breath sounds normal.  Lymphadenopathy:       Head (right side): No tonsillar, no preauricular, no posterior auricular and no occipital adenopathy present.       Head (left side): No tonsillar, no preauricular, no posterior auricular and no occipital adenopathy present.    He has no cervical adenopathy.       Right: No supraclavicular adenopathy present.       Left: No supraclavicular adenopathy present.  Neurological: He is alert and oriented to person, place, and time. No sensory deficit.  Skin: Skin is warm, dry and intact. No rash noted. No cyanosis or erythema. Nails show no clubbing.  Psychiatric: He has a normal mood and affect. His speech is normal and behavior is normal.       Assessment & Plan:   Problem List Items Addressed This Visit    Hypertension    Well controlled. COntinue olmesartan.      Relevant Medications   olmesartan (BENICAR) 20 MG tablet   Other Relevant Orders   CBC with Differential/Platelet   Comprehensive metabolic panel   Hyperlipidemia    Await labs. Adjust regimen as indicated by results. Goal LDL <70. 71 last visit.      Relevant Medications   olmesartan (BENICAR) 20 MG tablet   Other Relevant Orders   Comprehensive metabolic panel   Lipid panel   Diabetes ([place]) - Primary    Await labs. Adjust regimen as indicated by results.      Relevant Medications   olmesartan (BENICAR) 20 MG tablet   Other Relevant Orders   Comprehensive metabolic panel   Hemoglobin A1c   Microalbumin / creatinine urine ratio       Return in about 3 months (around 02/28/2018) for re-evaluation of diabetes, HTN, lipids.   Fara Chute, PA-C [facility] at [place]

## 2017-11-30 NOTE — Patient Instructions (Signed)
     IF you received an x-ray today, you will receive an invoice from [facility]. Please contact [facility] at [phone] with questions or concerns regarding your invoice.   IF you received labwork today, you will receive an invoice from [facility]. Please contact [facility] at [phone] with questions or concerns regarding your invoice.   Our billing staff will not be able to assist you with questions regarding bills from these companies.  You will be contacted with the lab results as soon as they are available. The fastest way to get your results is to activate your My Chart account. Instructions are located on the last page of this paperwork. If you have not heard from us regarding the results in 2 weeks, please contact this office.     

## 2017-11-30 NOTE — Assessment & Plan Note (Signed)
Well controlled. COntinue olmesartan.

## 2017-11-30 NOTE — Assessment & Plan Note (Signed)
Await labs. Adjust regimen as indicated by results. Goal LDL <70. 71 last visit.

## 2017-12-01 LAB — LIPID PANEL
Chol/HDL Ratio: 3.8 ratio (ref 0.0–5.0)
Cholesterol, Total: 144 mg/dL (ref 100–199)
HDL: 38 mg/dL — ABNORMAL LOW
LDL Calculated: 77 mg/dL (ref 0–99)
Triglycerides: 147 mg/dL (ref 0–149)
VLDL Cholesterol Cal: 29 mg/dL (ref 5–40)

## 2017-12-01 LAB — COMPREHENSIVE METABOLIC PANEL WITH GFR
ALT: 22 IU/L (ref 0–44)
AST: 19 IU/L (ref 0–40)
Albumin/Globulin Ratio: 1.6 (ref 1.2–2.2)
Albumin: 4.3 g/dL (ref 3.6–4.8)
Alkaline Phosphatase: 64 IU/L (ref 39–117)
BUN/Creatinine Ratio: 11 (ref 10–24)
BUN: 13 mg/dL (ref 8–27)
Bilirubin Total: 0.5 mg/dL (ref 0.0–1.2)
CO2: 24 mmol/L (ref 20–29)
Calcium: 9.6 mg/dL (ref 8.6–10.2)
Chloride: 101 mmol/L (ref 96–106)
Creatinine, Ser: 1.2 mg/dL (ref 0.76–1.27)
GFR calc Af Amer: 72 mL/min/1.73
GFR calc non Af Amer: 62 mL/min/1.73
Globulin, Total: 2.7 g/dL (ref 1.5–4.5)
Glucose: 115 mg/dL — ABNORMAL HIGH (ref 65–99)
Potassium: 4.7 mmol/L (ref 3.5–5.2)
Sodium: 141 mmol/L (ref 134–144)
Total Protein: 7 g/dL (ref 6.0–8.5)

## 2017-12-01 LAB — CBC WITH DIFFERENTIAL/PLATELET
Basophils Absolute: 0 x10E3/uL (ref 0.0–0.2)
Basos: 0 %
EOS (ABSOLUTE): 0.2 x10E3/uL (ref 0.0–0.4)
Eos: 3 %
Hematocrit: 41.1 % (ref 37.5–51.0)
Hemoglobin: 14.1 g/dL (ref 13.0–17.7)
Immature Grans (Abs): 0 x10E3/uL (ref 0.0–0.1)
Immature Granulocytes: 0 %
Lymphocytes Absolute: 2 x10E3/uL (ref 0.7–3.1)
Lymphs: 29 %
MCH: 32.4 pg (ref 26.6–33.0)
MCHC: 34.3 g/dL (ref 31.5–35.7)
MCV: 95 fL (ref 79–97)
Monocytes Absolute: 0.4 x10E3/uL (ref 0.1–0.9)
Monocytes: 7 %
Neutrophils Absolute: 4.1 x10E3/uL (ref 1.4–7.0)
Neutrophils: 61 %
Platelets: 266 x10E3/uL (ref 150–379)
RBC: 4.35 x10E6/uL (ref 4.14–5.80)
RDW: 13.5 % (ref 12.3–15.4)
WBC: 6.7 x10E3/uL (ref 3.4–10.8)

## 2017-12-01 LAB — HEMOGLOBIN A1C
Est. average glucose Bld gHb Est-mCnc: 134 mg/dL
Hgb A1c MFr Bld: 6.3 % — ABNORMAL HIGH (ref 4.8–5.6)

## 2017-12-01 LAB — MICROALBUMIN / CREATININE URINE RATIO
CREATININE, UR: 130.6 mg/dL
MICROALBUM., U, RANDOM: 3.3 ug/mL
Microalb/Creat Ratio: 2.5 mg/g creat (ref 0.0–30.0)

## 2017-12-09 DIAGNOSIS — I788 Other diseases of capillaries: Secondary | ICD-10-CM | POA: Diagnosis not present

## 2017-12-09 DIAGNOSIS — L738 Other specified follicular disorders: Secondary | ICD-10-CM | POA: Diagnosis not present

## 2017-12-09 DIAGNOSIS — L570 Actinic keratosis: Secondary | ICD-10-CM | POA: Diagnosis not present

## 2017-12-09 DIAGNOSIS — L821 Other seborrheic keratosis: Secondary | ICD-10-CM | POA: Diagnosis not present

## 2017-12-09 DIAGNOSIS — L718 Other rosacea: Secondary | ICD-10-CM | POA: Diagnosis not present

## 2018-02-07 ENCOUNTER — Encounter: Payer: Self-pay | Admitting: Physician Assistant

## 2018-03-08 ENCOUNTER — Encounter: Payer: Self-pay | Admitting: Physician Assistant

## 2018-03-08 ENCOUNTER — Ambulatory Visit (INDEPENDENT_AMBULATORY_CARE_PROVIDER_SITE_OTHER): Payer: Medicare Other | Admitting: Physician Assistant

## 2018-03-08 ENCOUNTER — Other Ambulatory Visit: Payer: Self-pay

## 2018-03-08 VITALS — BP 122/74 | HR 76 | Temp 97.7°F | Resp 16 | Ht 72.5 in | Wt 243.2 lb

## 2018-03-08 DIAGNOSIS — E119 Type 2 diabetes mellitus without complications: Secondary | ICD-10-CM | POA: Diagnosis not present

## 2018-03-08 DIAGNOSIS — I10 Essential (primary) hypertension: Secondary | ICD-10-CM

## 2018-03-08 DIAGNOSIS — E785 Hyperlipidemia, unspecified: Secondary | ICD-10-CM

## 2018-03-08 DIAGNOSIS — M1990 Unspecified osteoarthritis, unspecified site: Secondary | ICD-10-CM | POA: Diagnosis not present

## 2018-03-08 NOTE — Progress Notes (Signed)
Patient ID: Daniel Ramirez, male    DOB: 1951-10-24, 67 y.o.   MRN: [id number]  PCP: Harrison Mons, PA-C  Chief Complaint  Patient presents with  . Diabetes    follow up     Subjective:   Presents for evaluation of diabetes, HTN, hyperlipidemia.  Doing well overall, though knee pain has limited his exercise. Has gained some weight with reduced activity. Still not interested in specialty care. Anticipates the need for TKR, but plans to wait as long as possible.  Diabetes has been well controlled. A1C 6.3% last visit. Tolerating medications.    Review of Systems  Constitutional: Negative for activity change, appetite change, fatigue and unexpected weight change.  HENT: Negative for congestion, dental problem, ear pain, hearing loss, mouth sores, postnasal drip, rhinorrhea, sneezing, sore throat, tinnitus and trouble swallowing.   Eyes: Negative for photophobia, pain, redness and visual disturbance.  Respiratory: Negative for cough, chest tightness and shortness of breath.   Cardiovascular: Negative for chest pain, palpitations and leg swelling.  Gastrointestinal: Negative for abdominal pain, blood in stool, constipation, diarrhea, nausea and vomiting.  Endocrine: Negative for cold intolerance, heat intolerance, polydipsia, polyphagia and polyuria.  Genitourinary: Negative for dysuria, frequency, hematuria and urgency.  Musculoskeletal: Negative for arthralgias, gait problem, myalgias and neck stiffness.  Skin: Negative for rash.  Neurological: Negative for dizziness, speech difficulty, weakness, light-headedness, numbness and headaches.  Hematological: Negative for adenopathy.  Psychiatric/Behavioral: Negative for confusion and sleep disturbance. The patient is not nervous/anxious.      Patient Active Problem List   Diagnosis Date Noted  . Diabetes ([place]) 05/07/2015  . BMI 31.0-31.9,adult 08/28/2014  . PFO (patent foramen ovale) 08/02/2012  . Hypertension 04/12/2012  .  Hyperlipidemia 04/12/2012  . RBBB 04/12/2012  . Arthritis 04/12/2012     Prior to Admission medications   Medication Sig Start Date End Date Taking? Authorizing Provider  Ascorbic Acid (VITAMIN C PO) Take by mouth daily.   Yes [provider]  aspirin 81 MG tablet Take 81 mg by mouth daily.   Yes [provider]  cetirizine (ZYRTEC) 10 MG tablet Take 10 mg by mouth daily.   Yes [provider]  Cyanocobalamin (VITAMIN B12 PO) Take by mouth daily.   Yes [provider]  metFORMIN (GLUCOPHAGE) 500 MG tablet TAKE 1 TABLET BY MOUTH TWO  TIMES DAILY WITH A MEAL 09/13/17  Yes Gracyn Allor, PA-C  Multiple Vitamin (MULTIVITAMIN) tablet Take 1 tablet by mouth daily.   Yes [provider]  olmesartan (BENICAR) 20 MG tablet TAKE 1/2 (ONE-HALF) TABLET BY MOUTH DAILY. 11/30/17  Yes Shashana Fullington, PA-C  pravastatin (PRAVACHOL) 40 MG tablet TAKE 1 TABLET BY MOUTH  DAILY 09/13/17  Yes Earnest Thalman, PA-C     No Known Allergies     Objective:  Physical Exam  Constitutional: He is oriented to person, place, and time. He appears well-developed and well-nourished. He is active and cooperative. No distress.  BP 122/74   Pulse 76   Temp 97.7 F (36.5 C)   Resp 16   Ht 6' 0.5" (1.842 m)   Wt 243 lb 3.2 oz (110.3 kg)   SpO2 95%   BMI 32.53 kg/m   HENT:  Head: Normocephalic and atraumatic.  Right Ear: Hearing normal.  Left Ear: Hearing normal.  Eyes: Conjunctivae are normal. No scleral icterus.  Neck: Normal range of motion. Neck supple. No thyromegaly present.  Cardiovascular: Normal rate, regular rhythm and normal heart sounds.  Pulses:  Radial pulses are 2+ on the right side, and 2+ on the left side.  Pulmonary/Chest: Effort normal and breath sounds normal.  Lymphadenopathy:       Head (right side): No tonsillar, no preauricular, no posterior auricular and no occipital adenopathy present.       Head (left side): No tonsillar, no  preauricular, no posterior auricular and no occipital adenopathy present.    He has no cervical adenopathy.       Right: No supraclavicular adenopathy present.       Left: No supraclavicular adenopathy present.  Neurological: He is alert and oriented to person, place, and time. No sensory deficit.  Skin: Skin is warm, dry and intact. No rash noted. No cyanosis or erythema. Nails show no clubbing.  Psychiatric: He has a normal mood and affect. His speech is normal and behavior is normal.     Wt Readings from Last 3 Encounters:  03/08/18 243 lb 3.2 oz (110.3 kg)  11/30/17 237 lb (107.5 kg)  10/21/17 235 lb 3.2 oz (106.7 kg)   Diabetic Foot Exam - Simple   Simple Foot Form Diabetic Foot exam was performed with the following findings:  Yes 03/08/2018  8:06 AM  Visual Inspection No deformities, no ulcerations, no other skin breakdown bilaterally:  Yes Sensation Testing Intact to touch and monofilament testing bilaterally:  Yes Pulse Check Posterior Tibialis and Dorsalis pulse intact bilaterally:  Yes Comments        Assessment & Plan:   Problem List Items Addressed This Visit    Hypertension    Well controlled. Continue current regimen with olmesartan 10 mg daily.      Relevant Orders   CBC with Differential/Platelet   Hyperlipidemia    Await labs. Goal LDL <70. Would change from pravastatin to atorvastatin if needs additional lipid lowering.      Relevant Orders   Lipid panel   Comprehensive metabolic panel   Arthritis    Intermittently limits his activity. Not ready for specialty care.      Diabetes ([place]) - Primary    Has been well controlled. Await A1C. If needs additional control, would increase metformin to 1000 mg BID.      Relevant Orders   Hemoglobin A1c   Comprehensive metabolic panel       Return in about 3 months (around 06/08/2018) for diabetes, blood pressure, cholesterol with Tenna Delaine, PA-C.   Fara Chute, PA-C Primary Care at  [place]

## 2018-03-08 NOTE — Progress Notes (Signed)
Subjective:    Patient ID: Daniel Ramirez, male    DOB: January 31, 1951, 67 y.o.   MRN: [id number] Chief Complaint  Patient presents with  . Diabetes    follow up     HPI  67 yo male presents for follow up of Diabetes, HTN, HLD. He reports doing well.  Does not record blood sugars at home. He eats well in general, but if he has cravings, he eats whatever it is, chocolate, ice cream, etc. Exercise: Less active than usual due to knee pain Medications: Metformin is doing well, no concerns. Denies N/V/D Last A1c: 6.3%  Does not take blood pressure at home.  Denies polydipsia, polyphagia, polyuria, SOB, chest pains, abdominal pain, dizziness, light-headedness, headache, myalgias.   Review of Systems  As above  Patient Active Problem List   Diagnosis Date Noted  . Diabetes ([place]) 05/07/2015  . BMI 31.0-31.9,adult 08/28/2014  . PFO (patent foramen ovale) 08/02/2012  . Hypertension 04/12/2012  . Hyperlipidemia 04/12/2012  . RBBB 04/12/2012  . Arthritis 04/12/2012    Past Medical History:  Diagnosis Date  . Allergy   . Arthritis   . Cardiac arrhythmia   . Cataract   . Cataract, left eye   . Detached retina   . Diverticulosis   . Gallstones   . HTN (hypertension)   . Internal hemorrhoid   . Pneumonia     Prior to Admission medications   Medication Sig Start Date End Date Taking? Authorizing Provider  Ascorbic Acid (VITAMIN C PO) Take by mouth daily.   Yes [provider]  aspirin 81 MG tablet Take 81 mg by mouth daily.   Yes [provider]  cetirizine (ZYRTEC) 10 MG tablet Take 10 mg by mouth daily.   Yes [provider]  Cyanocobalamin (VITAMIN B12 PO) Take by mouth daily.   Yes [provider]  metFORMIN (GLUCOPHAGE) 500 MG tablet TAKE 1 TABLET BY MOUTH TWO  TIMES DAILY WITH A MEAL 09/13/17  Yes Jeffery, Chelle, PA-C  Multiple Vitamin (MULTIVITAMIN) tablet Take 1 tablet by mouth daily.   Yes [provider]  olmesartan  (BENICAR) 20 MG tablet TAKE 1/2 (ONE-HALF) TABLET BY MOUTH DAILY. 11/30/17  Yes Jeffery, Chelle, PA-C  pravastatin (PRAVACHOL) 40 MG tablet TAKE 1 TABLET BY MOUTH  DAILY 09/13/17  Yes Jeffery, Chelle, PA-C    No Known Allergies      Objective:   Physical Exam  Constitutional: He is oriented to person, place, and time. He appears well-developed and well-nourished. No distress.  BP 122/74   Pulse 76   Temp 97.7 F (36.5 C)   Resp 16   Ht 6' 0.5" (1.842 m)   Wt 243 lb 3.2 oz (110.3 kg)   SpO2 95%   BMI 32.53 kg/m    HENT:  Head: Normocephalic and atraumatic.  Eyes: Conjunctivae are normal. Right eye exhibits no discharge. Left eye exhibits no discharge.  Neck: Normal range of motion. Neck supple. No thyromegaly present.  Cardiovascular: Normal rate, regular rhythm, normal heart sounds and intact distal pulses.  Pulmonary/Chest: Effort normal and breath sounds normal.  Neurological: He is alert and oriented to person, place, and time.  Skin: Skin is warm and dry. He is not diaphoretic.  Psychiatric: He has a normal mood and affect. His behavior is normal.     Wt Readings from Last 3 Encounters:  03/08/18 243 lb 3.2 oz (110.3 kg)  11/30/17 237 lb (107.5 kg)  10/21/17 235 lb 3.2 oz (106.7  kg)   Diabetic Foot Exam - Simple   Simple Foot Form Diabetic Foot exam was performed with the following findings:  Yes 03/08/2018  8:06 AM  Visual Inspection No deformities, no ulcerations, no other skin breakdown bilaterally:  Yes Sensation Testing Intact to touch and monofilament testing bilaterally:  Yes Pulse Check Posterior Tibialis and Dorsalis pulse intact bilaterally:  Yes Comments       Assessment & Plan:  1. Type 2 diabetes mellitus without complication, without long-term current use of insulin ([place]) Has been well controlled with metformin and lifestyle changes. Await labs and f/u If uncontrolled, increase metformin to 1000mg  dosing. Advised to try some aerobic exercise due  to knee pain. - Hemoglobin A1c - Comprehensive metabolic panel  2. Hyperlipidemia, unspecified hyperlipidemia type Await labs and f/u LDL goal <70, last visit 77. - Lipid panel - Comprehensive metabolic panel  3. Essential hypertension Well controlled, continue with Olmesartan 10mg  - CBC with Differential/Platelet  4. Arthritis  Does not want any intervention at this time.

## 2018-03-08 NOTE — Assessment & Plan Note (Signed)
Well controlled. Continue current regimen with olmesartan 10 mg daily.

## 2018-03-08 NOTE — Patient Instructions (Addendum)
     IF you received an x-ray today, you will receive an invoice from [facility]. Please contact [facility] at [phone] with questions or concerns regarding your invoice.   IF you received labwork today, you will receive an invoice from [facility]. Please contact [facility] at [phone] with questions or concerns regarding your invoice.   Our billing staff will not be able to assist you with questions regarding bills from these companies.  You will be contacted with the lab results as soon as they are available. The fastest way to get your results is to activate your My Chart account. Instructions are located on the last page of this paperwork. If you have not heard from us regarding the results in 2 weeks, please contact this office.     

## 2018-03-08 NOTE — Assessment & Plan Note (Signed)
Await labs. Goal LDL <70. Would change from pravastatin to atorvastatin if needs additional lipid lowering.

## 2018-03-08 NOTE — Assessment & Plan Note (Signed)
Intermittently limits his activity. Not ready for specialty care.

## 2018-03-08 NOTE — Assessment & Plan Note (Signed)
Has been well controlled. Await A1C. If needs additional control, would increase metformin to 1000 mg BID.

## 2018-03-09 LAB — CBC WITH DIFFERENTIAL/PLATELET
BASOS: 0 %
Basophils Absolute: 0 10*3/uL (ref 0.0–0.2)
EOS (ABSOLUTE): 0.3 10*3/uL (ref 0.0–0.4)
EOS: 4 %
HEMATOCRIT: 43.1 % (ref 37.5–51.0)
HEMOGLOBIN: 14.2 g/dL (ref 13.0–17.7)
IMMATURE GRANULOCYTES: 0 %
Immature Grans (Abs): 0 10*3/uL (ref 0.0–0.1)
Lymphocytes Absolute: 2.7 10*3/uL (ref 0.7–3.1)
Lymphs: 35 %
MCH: 31.6 pg (ref 26.6–33.0)
MCHC: 32.9 g/dL (ref 31.5–35.7)
MCV: 96 fL (ref 79–97)
MONOCYTES: 8 %
MONOS ABS: 0.6 10*3/uL (ref 0.1–0.9)
Neutrophils Absolute: 4.2 10*3/uL (ref 1.4–7.0)
Neutrophils: 53 %
Platelets: 282 10*3/uL (ref 150–379)
RBC: 4.5 x10E6/uL (ref 4.14–5.80)
RDW: 13.4 % (ref 12.3–15.4)
WBC: 7.9 10*3/uL (ref 3.4–10.8)

## 2018-03-09 LAB — COMPREHENSIVE METABOLIC PANEL
A/G RATIO: 1.7 (ref 1.2–2.2)
ALBUMIN: 4.5 g/dL (ref 3.6–4.8)
ALT: 19 IU/L (ref 0–44)
AST: 20 IU/L (ref 0–40)
Alkaline Phosphatase: 62 IU/L (ref 39–117)
BUN / CREAT RATIO: 11 (ref 10–24)
BUN: 13 mg/dL (ref 8–27)
Bilirubin Total: 0.5 mg/dL (ref 0.0–1.2)
CO2: 23 mmol/L (ref 20–29)
CREATININE: 1.19 mg/dL (ref 0.76–1.27)
Calcium: 10.2 mg/dL (ref 8.6–10.2)
Chloride: 104 mmol/L (ref 96–106)
GFR calc Af Amer: 73 mL/min/{1.73_m2} (ref >59)
GFR calc non Af Amer: 63 mL/min/{1.73_m2} (ref >59)
GLOBULIN, TOTAL: 2.7 g/dL (ref 1.5–4.5)
Glucose: 116 mg/dL — ABNORMAL HIGH (ref 65–99)
Potassium: 4.9 mmol/L (ref 3.5–5.2)
SODIUM: 143 mmol/L (ref 134–144)
Total Protein: 7.2 g/dL (ref 6.0–8.5)

## 2018-03-09 LAB — LIPID PANEL
CHOLESTEROL TOTAL: 151 mg/dL (ref 100–199)
Chol/HDL Ratio: 3.7 ratio (ref 0.0–5.0)
HDL: 41 mg/dL (ref >39)
LDL CALC: 82 mg/dL (ref 0–99)
TRIGLYCERIDES: 141 mg/dL (ref 0–149)
VLDL CHOLESTEROL CAL: 28 mg/dL (ref 5–40)

## 2018-03-09 LAB — HEMOGLOBIN A1C
ESTIMATED AVERAGE GLUCOSE: 140 mg/dL
Hgb A1c MFr Bld: 6.5 % — ABNORMAL HIGH (ref 4.8–5.6)

## 2018-03-15 ENCOUNTER — Encounter: Payer: Self-pay | Admitting: Physician Assistant

## 2018-03-15 DIAGNOSIS — H5213 Myopia, bilateral: Secondary | ICD-10-CM | POA: Diagnosis not present

## 2018-03-15 DIAGNOSIS — H35371 Puckering of macula, right eye: Secondary | ICD-10-CM | POA: Diagnosis not present

## 2018-03-15 DIAGNOSIS — E119 Type 2 diabetes mellitus without complications: Secondary | ICD-10-CM | POA: Diagnosis not present

## 2018-03-15 LAB — HM DIABETES EYE EXAM

## 2018-06-09 ENCOUNTER — Ambulatory Visit: Payer: Medicare Other | Admitting: Physician Assistant

## 2018-06-10 ENCOUNTER — Other Ambulatory Visit: Payer: Self-pay | Admitting: Physician Assistant

## 2018-06-10 DIAGNOSIS — E785 Hyperlipidemia, unspecified: Secondary | ICD-10-CM

## 2018-06-10 NOTE — Telephone Encounter (Signed)
Copied from [place] [id number]. Topic: Quick Communication - Rx Refill/Question >> Jun 10, 2018  9:56 AM Rutherford Nail, NT wrote: **Patient contacted pharmacy 10 days ago and pharmacy states they have not heard anything back. Patient has only 5 days of pills left.**  Medication: pravastatin (PRAVACHOL) 40 MG tablet   Has the patient contacted their pharmacy? Yes.   (Agent: If no, request that the patient contact the pharmacy for the refill.) (Agent: If yes, when and what did the pharmacy advise?)  Preferred Pharmacy (with phone number or street name): [facility]  Agent: Please be advised that RX refills may take up to 3 business days. We ask that you follow-up with your pharmacy.

## 2018-06-10 NOTE — Telephone Encounter (Signed)
Pravastatin (Pravachol) refill Last Refill:09/13/17 #90 Last OV: 03/08/18 PCP: former pt of [place]: [facility]

## 2018-06-13 NOTE — Telephone Encounter (Signed)
Pt checking on status of rx - says he contacted pharmacy 2 weeks ago, he is almost out of meds.

## 2018-06-15 MED ORDER — PRAVASTATIN SODIUM 40 MG PO TABS: 40.0000 mg | ORAL_TABLET | Freq: Every day | ORAL | 0 refills | Status: DC

## 2018-08-23 ENCOUNTER — Encounter: Payer: Medicare Other | Admitting: Emergency Medicine

## 2018-08-23 ENCOUNTER — Encounter: Payer: Medicare Other | Admitting: Physician Assistant

## 2018-08-23 ENCOUNTER — Ambulatory Visit: Payer: Medicare Other

## 2018-08-25 ENCOUNTER — Ambulatory Visit (INDEPENDENT_AMBULATORY_CARE_PROVIDER_SITE_OTHER): Payer: Medicare Other | Admitting: Emergency Medicine

## 2018-08-25 ENCOUNTER — Other Ambulatory Visit: Payer: Self-pay

## 2018-08-25 ENCOUNTER — Telehealth: Payer: Self-pay | Admitting: *Deleted

## 2018-08-25 ENCOUNTER — Encounter: Payer: Self-pay | Admitting: Emergency Medicine

## 2018-08-25 VITALS — BP 132/76 | HR 75 | Temp 98.1°F | Resp 16 | Ht 71.5 in | Wt 242.6 lb

## 2018-08-25 DIAGNOSIS — E785 Hyperlipidemia, unspecified: Secondary | ICD-10-CM

## 2018-08-25 DIAGNOSIS — I10 Essential (primary) hypertension: Secondary | ICD-10-CM

## 2018-08-25 DIAGNOSIS — E119 Type 2 diabetes mellitus without complications: Secondary | ICD-10-CM | POA: Diagnosis not present

## 2018-08-25 DIAGNOSIS — Z23 Encounter for immunization: Secondary | ICD-10-CM | POA: Diagnosis not present

## 2018-08-25 DIAGNOSIS — Z0000 Encounter for general adult medical examination without abnormal findings: Secondary | ICD-10-CM

## 2018-08-25 MED ORDER — OLMESARTAN MEDOXOMIL 20 MG PO TABS: ORAL_TABLET | ORAL | 3 refills | Status: DC

## 2018-08-25 MED ORDER — PRAVASTATIN SODIUM 40 MG PO TABS: 40.0000 mg | ORAL_TABLET | Freq: Every day | ORAL | 3 refills | Status: DC

## 2018-08-25 MED ORDER — OLMESARTAN MEDOXOMIL 20 MG PO TABS: ORAL_TABLET | ORAL | 3 refills | Status: AC

## 2018-08-25 MED ORDER — PRAVASTATIN SODIUM 40 MG PO TABS: 40.0000 mg | ORAL_TABLET | Freq: Every day | ORAL | 3 refills | Status: AC

## 2018-08-25 MED ORDER — METFORMIN HCL 500 MG PO TABS: ORAL_TABLET | ORAL | 3 refills | Status: DC

## 2018-08-25 MED ORDER — METFORMIN HCL 500 MG PO TABS: ORAL_TABLET | ORAL | 3 refills | Status: AC

## 2018-08-25 NOTE — Addendum Note (Signed)
Addended by: Suszanne Finch on: 08/25/2018 09:13 AM   Modules accepted: Orders

## 2018-08-25 NOTE — Patient Instructions (Addendum)

## 2018-08-25 NOTE — Progress Notes (Signed)
Daniel Ramirez 67 y.o.   Chief Complaint  Patient presents with  . Annual Exam    AWV    HISTORY OF PRESENT ILLNESS: This is a 67 y.o. male Here for annual exam; no complaints and no medical concerns. Has a history of diabetes on metformin, hypertension on Benicar, and dyslipidemia on pravastatin. Smoking: No Drinking: No Sleeping: Adequate Work: Regular hours Exercise: Walking Stress: Moderate Nutrition: Adequate   HPI   Prior to Admission medications   Medication Sig Start Date End Date Taking? Authorizing Provider  Ascorbic Acid (VITAMIN C PO) Take by mouth daily.   Yes [provider]  aspirin 81 MG tablet Take 81 mg by mouth daily.   Yes [provider]  cetirizine (ZYRTEC) 10 MG tablet Take 10 mg by mouth daily.   Yes [provider]  Cyanocobalamin (VITAMIN B12 PO) Take by mouth daily.   Yes [provider]  metFORMIN (GLUCOPHAGE) 500 MG tablet TAKE 1 TABLET BY MOUTH TWO  TIMES DAILY WITH A MEAL 09/13/17  Yes Harrison Mons, PA  Multiple Vitamin (MULTIVITAMIN) tablet Take 1 tablet by mouth daily.   Yes [provider]  olmesartan (BENICAR) 20 MG tablet TAKE 1/2 (ONE-HALF) TABLET BY MOUTH DAILY. 11/30/17  Yes Jeffery, Chelle, PA  pravastatin (PRAVACHOL) 40 MG tablet Take 1 tablet (40 mg total) by mouth daily. 06/15/18  Yes SagardiaInes Bloomer, MD    No Known Allergies  Patient Active Problem List   Diagnosis Date Noted  . Diabetes ([place]) 05/07/2015  . BMI 31.0-31.9,adult 08/28/2014  . PFO (patent foramen ovale) 08/02/2012  . Hypertension 04/12/2012  . Hyperlipidemia 04/12/2012  . RBBB 04/12/2012  . Arthritis 04/12/2012    Past Medical History:  Diagnosis Date  . Allergy   . Arthritis   . Cardiac arrhythmia   . Cataract   . Cataract, left eye   . Detached retina   . Diverticulosis   . Gallstones   . HTN (hypertension)   . Internal hemorrhoid   . Pneumonia     Past Surgical History:  Procedure Laterality  Date  . CATARACT EXTRACTION Right   . CHOLECYSTECTOMY  2011  . KNEE ARTHROSCOPY Right   . KNEE CARTILAGE SURGERY Right    x 2  . RETINAL DETACHMENT SURGERY Right    x 2  . TONSILLECTOMY    . YAG LASER APPLICATION Right     Social History   Socioeconomic History  . Marital status: Married    Spouse name: Neoma Laming  . Number of children: 2  . Years of education: BS+  . Highest education level: Not on file  Occupational History  . Occupation: Vending/self employed  Social Needs  . Financial resource strain: Not on file  . Food insecurity:    Worry: Not on file    Inability: Not on file  . Transportation needs:    Medical: Not on file    Non-medical: Not on file  Tobacco Use  . Smoking status: Former Smoker    Packs/day: 1.00    Years: 0.80    Pack years: 0.80    Types: Cigarettes    Last attempt to quit: 01/31/1969    Years since quitting: 49.5  . Smokeless tobacco: Never Used  Substance and Sexual Activity  . Alcohol use: No  . Drug use: No  . Sexual activity: Yes    Partners: Female    Birth control/protection: None  Lifestyle  . Physical activity:    Days per week:  Not on file    Minutes per session: Not on file  . Stress: Not on file  Relationships  . Social connections:    Talks on phone: Not on file    Gets together: Not on file    Attends religious service: Not on file    Active member of club or organization: Not on file    Attends meetings of clubs or organizations: Not on file    Relationship status: Not on file  . Intimate partner violence:    Fear of current or ex partner: Not on file    Emotionally abused: Not on file    Physically abused: Not on file    Forced sexual activity: Not on file  Other Topics Concern  . Not on file  Social History Narrative   Lives with his wife, their pets. Foster older dogs.   One adult child lives in [place], the other in [place].   He has been doing physical work at her house since 08/28/2014, and feels it  is therapeutic for him.      Family History  Problem Relation Age of Onset  . Heart disease Father        MI  . Rheum arthritis Mother      Review of Systems  Constitutional: Negative.  Negative for chills, fever and weight loss.  HENT: Negative.  Negative for congestion, hearing loss, nosebleeds and sore throat.   Eyes: Negative.  Negative for blurred vision, double vision, discharge and redness.  Respiratory: Negative.  Negative for cough, shortness of breath and wheezing.   Cardiovascular: Negative.  Negative for chest pain, palpitations, leg swelling and PND.  Gastrointestinal: Negative.  Negative for abdominal pain, blood in stool, diarrhea, melena, nausea and vomiting.  Genitourinary: Negative.  Negative for dysuria, flank pain and hematuria.  Musculoskeletal: Negative.  Negative for back pain, joint pain, myalgias and neck pain.  Skin: Negative.  Negative for rash.  Neurological: Negative.  Negative for dizziness, sensory change, loss of consciousness, weakness and headaches.  Endo/Heme/Allergies: Negative.   Psychiatric/Behavioral: Negative.   All other systems reviewed and are negative.   Vitals:   08/25/18 0809  BP: 132/76  Pulse: 75  Resp: 16  Temp: 98.1 F (36.7 C)  SpO2: 96%    Physical Exam  Constitutional: He is oriented to person, place, and time. He appears well-nourished.  HENT:  Head: Normocephalic and atraumatic.  Right Ear: External ear normal.  Left Ear: External ear normal.  Nose: Nose normal.  Mouth/Throat: Oropharynx is clear and moist.  Eyes: Pupils are equal, round, and reactive to light. Conjunctivae and EOM are normal.  Neck: Normal range of motion. Neck supple. No JVD present. No thyromegaly present.  Cardiovascular: Normal rate, regular rhythm, normal heart sounds and intact distal pulses.  Pulmonary/Chest: Effort normal and breath sounds normal.  Abdominal: Soft. Bowel sounds are normal. He exhibits no distension. There is no  tenderness.  Musculoskeletal: Normal range of motion. He exhibits no edema or tenderness.  Lymphadenopathy:    He has no cervical adenopathy.  Neurological: He is alert and oriented to person, place, and time. He displays normal reflexes. No sensory deficit. He exhibits normal muscle tone. Coordination normal.  Skin: Skin is warm and dry. Capillary refill takes less than 2 seconds. No rash noted.  Psychiatric: He has a normal mood and affect. His behavior is normal.  Vitals reviewed.    ASSESSMENT & PLAN: Daniel Ramirez was seen today for annual exam.  Diagnoses and  all orders for this visit:  Encounter for Medicare annual wellness exam  Need for prophylactic vaccination and inoculation against influenza -     Flu vaccine HIGH DOSE PF  Type 2 diabetes mellitus without complication, without long-term current use of insulin ([place]) -     Comprehensive metabolic panel -     Hemoglobin A1c  Hyperlipidemia, unspecified hyperlipidemia type -     Lipid panel  Essential hypertension -     Comprehensive metabolic panel    Patient Instructions       If you have lab work done today you will be contacted with your lab results within the next 2 weeks.  If you have not heard from [place] then please contact us. The fastest way to get your results is to register for My Chart.   IF you received an x-ray today, you will receive an invoice from [facility]. Please contact [facility] at [phone] with questions or concerns regarding your invoice.   IF you received labwork today, you will receive an invoice from [facility] [facility]. Please contact [facility] at [phone] with questions or concerns regarding your invoice.   Our billing staff will not be able to assist you with questions regarding bills from these companies.  You will be contacted with the lab results as soon as they are available. The fastest way to get your results is to activate your My Chart account. Instructions are located  on the last page of this paperwork. If you have not heard from [place] regarding the results in 2 weeks, please contact this office.     Health Maintenance, Male A healthy lifestyle and preventive care is important for your health and wellness. Ask your health care provider about what schedule of regular examinations is right for you. What should I know about weight and diet? Eat a Healthy Diet  Eat plenty of vegetables, fruits, whole grains, low-fat dairy products, and lean protein.  Do not eat a lot of foods high in solid fats, added sugars, or salt.  Maintain a Healthy Weight Regular exercise can help you achieve or maintain a healthy weight. You should:  Do at least 150 minutes of exercise each week. The exercise should increase your heart rate and make you sweat (moderate-intensity exercise).  Do strength-training exercises at least twice a week.  Watch Your Levels of Cholesterol and Blood Lipids  Have your blood tested for lipids and cholesterol every 5 years starting at 68 years of age. If you are at high risk for heart disease, you should start having your blood tested when you are 68 years old. You may need to have your cholesterol levels checked more often if: ? Your lipid or cholesterol levels are high. ? You are older than 68 years of age. ? You are at high risk for heart disease.  What should I know about cancer screening? Many types of cancers can be detected early and may often be prevented. Lung Cancer  You should be screened every year for lung cancer if: ? You are a current smoker who has smoked for at least 30 years. ? You are a former smoker who has quit within the past 15 years.  Talk to your health care provider about your screening options, when you should start screening, and how often you should be screened.  Colorectal Cancer  Routine colorectal cancer screening usually begins at 68 years of age and should be repeated every 5-10 years until you are 68 years  old. You may  need to be screened more often if early forms of precancerous polyps or small growths are found. Your health care provider may recommend screening at an earlier age if you have risk factors for colon cancer.  Your health care provider may recommend using home test kits to check for hidden blood in the stool.  A small camera at the end of a tube can be used to examine your colon (sigmoidoscopy or colonoscopy). This checks for the earliest forms of colorectal cancer.  Prostate and Testicular Cancer  Depending on your age and overall health, your health care provider may do certain tests to screen for prostate and testicular cancer.  Talk to your health care provider about any symptoms or concerns you have about testicular or prostate cancer.  Skin Cancer  Check your skin from head to toe regularly.  Tell your health care provider about any new moles or changes in moles, especially if: ? There is a change in a mole's size, shape, or color. ? You have a mole that is larger than a pencil eraser.  Always use sunscreen. Apply sunscreen liberally and repeat throughout the day.  Protect yourself by wearing long sleeves, pants, a wide-brimmed hat, and sunglasses when outside.  What should I know about heart disease, diabetes, and high blood pressure?  If you are 33-6 years of age, have your blood pressure checked every 3-5 years. If you are 15 years of age or older, have your blood pressure checked every year. You should have your blood pressure measured twice-once when you are at a hospital or clinic, and once when you are not at a hospital or clinic. Record the average of the two measurements. To check your blood pressure when you are not at a hospital or clinic, you can use: ? An automated blood pressure machine at a pharmacy. ? A home blood pressure monitor.  Talk to your health care provider about your target blood pressure.  If you are between 47-64 years old, ask your  health care provider if you should take aspirin to prevent heart disease.  Have regular diabetes screenings by checking your fasting blood sugar level. ? If you are at a normal weight and have a low risk for diabetes, have this test once every three years after the age of 67. ? If you are overweight and have a high risk for diabetes, consider being tested at a younger age or more often.  A one-time screening for abdominal aortic aneurysm (AAA) by ultrasound is recommended for men aged 68-75 years who are current or former smokers. What should I know about preventing infection? Hepatitis B If you have a higher risk for hepatitis B, you should be screened for this virus. Talk with your health care provider to find out if you are at risk for hepatitis B infection. Hepatitis C Blood testing is recommended for:  Everyone born from [birth year] through 1966.  Anyone with known risk factors for hepatitis C.  Sexually Transmitted Diseases (STDs)  You should be screened each year for STDs including gonorrhea and chlamydia if: ? You are sexually active and are younger than 68 years of age. ? You are older than 68 years of age and your health care provider tells you that you are at risk for this type of infection. ? Your sexual activity has changed since you were last screened and you are at an increased risk for chlamydia or gonorrhea. Ask your health care provider if you are at risk.  Talk with  your health care provider about whether you are at high risk of being infected with HIV. Your health care provider may recommend a prescription medicine to help prevent HIV infection.  What else can I do?  Schedule regular health, dental, and eye exams.  Stay current with your vaccines (immunizations).  Do not use any tobacco products, such as cigarettes, chewing tobacco, and e-cigarettes. If you need help quitting, ask your health care provider.  Limit alcohol intake to no more than 2 drinks per day. One  drink equals 12 ounces of beer, 5 ounces of wine, or 1 ounces of hard liquor.  Do not use street drugs.  Do not share needles.  Ask your health care provider for help if you need support or information about quitting drugs.  Tell your health care provider if you often feel depressed.  Tell your health care provider if you have ever been abused or do not feel safe at home. This information is not intended to replace advice given to you by your health care provider. Make sure you discuss any questions you have with your health care provider. Document Released: 04/16/2008 Document Revised: 06/17/2016 Document Reviewed: 07/23/2015 Elsevier Interactive Patient Education  2018 Elsevier Inc.      Agustina Caroli, MD [facility]    Colorectal Cancer Screening:  Recently done.  Good for 10 years. Prostate Cancer Screening:  Recently done.  Told he was good. HIV Screening: completed STI Screening: very low risk Seasonal Influenza Vaccination:  Getting vaccine today Pneumococcal vaccine: Up-to-date Tdap: Up-to-date

## 2018-08-25 NOTE — Telephone Encounter (Signed)
Spoke to Dan(pharmacist) at [facility] and cancelled Metformin, Olmesartan and Pravastatin. The patient requested prescriptions be sent to [facility] mail delivery. I advised the pharmacist Rxs were reordered to [facility].

## 2018-08-26 LAB — COMPREHENSIVE METABOLIC PANEL
ALBUMIN: 4.4 g/dL (ref 3.6–4.8)
ALT: 29 IU/L (ref 0–44)
AST: 21 IU/L (ref 0–40)
Albumin/Globulin Ratio: 1.6 (ref 1.2–2.2)
Alkaline Phosphatase: 64 IU/L (ref 39–117)
BUN / CREAT RATIO: 12 (ref 10–24)
BUN: 15 mg/dL (ref 8–27)
Bilirubin Total: 0.6 mg/dL (ref 0.0–1.2)
CALCIUM: 9.7 mg/dL (ref 8.6–10.2)
CO2: 25 mmol/L (ref 20–29)
CREATININE: 1.22 mg/dL (ref 0.76–1.27)
Chloride: 101 mmol/L (ref 96–106)
GFR, EST AFRICAN AMERICAN: 70 mL/min/{1.73_m2} (ref >59)
GFR, EST NON AFRICAN AMERICAN: 61 mL/min/{1.73_m2} (ref >59)
GLUCOSE: 120 mg/dL — AB (ref 65–99)
Globulin, Total: 2.8 g/dL (ref 1.5–4.5)
Potassium: 4.1 mmol/L (ref 3.5–5.2)
Sodium: 140 mmol/L (ref 134–144)
Total Protein: 7.2 g/dL (ref 6.0–8.5)

## 2018-08-26 LAB — LIPID PANEL
CHOLESTEROL TOTAL: 152 mg/dL (ref 100–199)
Chol/HDL Ratio: 3.5 ratio (ref 0.0–5.0)
HDL: 43 mg/dL (ref >39)
LDL Calculated: 78 mg/dL (ref 0–99)
TRIGLYCERIDES: 156 mg/dL — AB (ref 0–149)
VLDL CHOLESTEROL CAL: 31 mg/dL (ref 5–40)

## 2018-08-26 LAB — HEMOGLOBIN A1C
ESTIMATED AVERAGE GLUCOSE: 137 mg/dL
Hgb A1c MFr Bld: 6.4 % — ABNORMAL HIGH (ref 4.8–5.6)

## 2018-12-08 DIAGNOSIS — L821 Other seborrheic keratosis: Secondary | ICD-10-CM | POA: Diagnosis not present

## 2018-12-08 DIAGNOSIS — Z85828 Personal history of other malignant neoplasm of skin: Secondary | ICD-10-CM | POA: Diagnosis not present

## 2018-12-08 DIAGNOSIS — L819 Disorder of pigmentation, unspecified: Secondary | ICD-10-CM | POA: Diagnosis not present

## 2018-12-08 DIAGNOSIS — L111 Transient acantholytic dermatosis [Grover]: Secondary | ICD-10-CM | POA: Diagnosis not present

## 2018-12-08 DIAGNOSIS — L570 Actinic keratosis: Secondary | ICD-10-CM | POA: Diagnosis not present

## 2018-12-08 DIAGNOSIS — D229 Melanocytic nevi, unspecified: Secondary | ICD-10-CM | POA: Diagnosis not present

## 2018-12-08 DIAGNOSIS — D1801 Hemangioma of skin and subcutaneous tissue: Secondary | ICD-10-CM | POA: Diagnosis not present

## 2018-12-08 DIAGNOSIS — L814 Other melanin hyperpigmentation: Secondary | ICD-10-CM | POA: Diagnosis not present

## 2019-03-17 DIAGNOSIS — H5213 Myopia, bilateral: Secondary | ICD-10-CM | POA: Diagnosis not present

## 2019-03-17 DIAGNOSIS — H2512 Age-related nuclear cataract, left eye: Secondary | ICD-10-CM | POA: Diagnosis not present

## 2019-03-17 DIAGNOSIS — E119 Type 2 diabetes mellitus without complications: Secondary | ICD-10-CM | POA: Diagnosis not present

## 2019-03-20 LAB — HM DIABETES EYE EXAM

## 2019-07-17 ENCOUNTER — Other Ambulatory Visit: Payer: Self-pay | Admitting: Emergency Medicine

## 2019-07-17 DIAGNOSIS — E119 Type 2 diabetes mellitus without complications: Secondary | ICD-10-CM

## 2019-07-17 DIAGNOSIS — E785 Hyperlipidemia, unspecified: Secondary | ICD-10-CM

## 2019-07-17 NOTE — Telephone Encounter (Signed)
LVM to schedule appt for med refills.

## 2019-07-17 NOTE — Telephone Encounter (Signed)
Requested medication (s) are due for refill today: yes  Requested medication (s) are on the active medication list: yes  Last refill:  05/29/2019  Future visit scheduled: no  Notes to clinic:  Review for refill   Requested Prescriptions  Pending Prescriptions Disp Refills   pravastatin (PRAVACHOL) 40 MG tablet [Pharmacy Med Name: PRAVASTATIN SOD 40MG TABLET] 90 tablet 3    Sig: TAKE 1 TABLET BY MOUTH  DAILY     Cardiovascular:  Antilipid - Statins Failed - 07/17/2019  6:31 AM      Failed - Triglycerides in normal range and within 360 days    Triglycerides  Date Value Ref Range Status  08/25/2018 156 (H) 0 - 149 mg/dL Final         Passed - Total Cholesterol in normal range and within 360 days    Cholesterol, Total  Date Value Ref Range Status  08/25/2018 152 100 - 199 mg/dL Final         Passed - LDL in normal range and within 360 days    LDL Calculated  Date Value Ref Range Status  08/25/2018 78 0 - 99 mg/dL Final         Passed - HDL in normal range and within 360 days    HDL  Date Value Ref Range Status  08/25/2018 43 >39 mg/dL Final         Passed - Patient is not pregnant      Passed - Valid encounter within last 12 months    Recent Outpatient Visits          10 months ago Encounter for [employer] annual wellness exam   Primary [facility] at [place], [place], [place]   1 year ago Type 2 diabetes mellitus without complication, without long-term current use of insulin ([place])   Primary Care at [facility], [place], [place]   1 year ago Type 2 diabetes mellitus without complication, without long-term current use of insulin ([place])   Primary Care at [facility], [place], [place]   1 year ago Cough   Primary [facility] at [place], [place] D, PA-C   1 year ago Type 2 diabetes mellitus without complication, without long-term current use of insulin ([place])   Primary Care at [facility], [place], [place]              metFORMIN (GLUCOPHAGE) 500 MG tablet [Pharmacy Med Name:  MetFORMIN 500MG TABLET] 180 tablet 3    Sig: TAKE 1 TABLET BY MOUTH  TWICE A DAY WITH MEALS     Endocrinology:  Diabetes - Biguanides Failed - 07/17/2019  6:31 AM      Failed - HBA1C is between 0 and 7.9 and within 180 days    Hgb A1c MFr Bld  Date Value Ref Range Status  08/25/2018 6.4 (H) 4.8 - 5.6 % Final    Comment:             Prediabetes: 5.7 - 6.4          Diabetes: >6.4          Glycemic control for adults with diabetes: <7.0          Failed - Valid encounter within last 6 months    Recent Outpatient Visits          10 months ago Encounter for Medicare annual wellness exam   Primary [facility] at [place], [place], [place]   1 year ago Type 2 diabetes mellitus without complication, without long-term current  use of insulin ([place])   Primary Care at [facility], [place], [place]   1 year ago Type 2 diabetes mellitus without complication, without long-term current use of insulin [facility])   Primary Care at [facility], [place], [place]   1 year ago Cough   Primary Care at [place], [place] D, PA-C   1 year ago Type 2 diabetes mellitus without complication, without long-term current use of insulin [facility])   Primary Care at [facility], [place], [place] in normal range and within 360 days    Creat  Date Value Ref Range Status  07/07/2016 1.16 0.70 - 1.25 mg/dL Final    Comment:      For patients > or = 69 years of age: The upper reference limit for Creatinine is approximately 13% higher for people identified as African-American.      Creatinine, Ser  Date Value Ref Range Status  08/25/2018 1.22 0.76 - 1.27 mg/dL Final         Passed - eGFR in normal range and within 360 days    GFR, Est African American  Date Value Ref Range Status  01/29/2016 77 >=60 mL/min Final   GFR calc Af Amer  Date Value Ref Range Status  08/25/2018 70 >59 mL/min/1.73 Final   GFR, Est Non African American  Date Value Ref Range Status  01/29/2016 66 >=60 mL/min Final     Comment:      The estimated GFR is a calculation valid for adults (>=40 years old) that uses the CKD-EPI algorithm to adjust for age and sex. It is   not to be used for children, pregnant women, hospitalized patients,    patients on dialysis, or with rapidly changing kidney function. According to the NKDEP, eGFR >89 is normal, 60-89 shows mild impairment, 30-59 shows moderate impairment, 15-29 shows severe impairment and <15 is ESRD.      GFR calc non Af Amer  Date Value Ref Range Status  08/25/2018 61 >59 mL/min/1.73 Final

## 2019-09-06 DIAGNOSIS — Z23 Encounter for immunization: Secondary | ICD-10-CM | POA: Diagnosis not present

## 2019-09-06 DIAGNOSIS — E119 Type 2 diabetes mellitus without complications: Secondary | ICD-10-CM | POA: Diagnosis not present

## 2019-09-06 DIAGNOSIS — E785 Hyperlipidemia, unspecified: Secondary | ICD-10-CM | POA: Diagnosis not present

## 2019-09-06 DIAGNOSIS — L570 Actinic keratosis: Secondary | ICD-10-CM | POA: Diagnosis not present

## 2019-09-06 DIAGNOSIS — I10 Essential (primary) hypertension: Secondary | ICD-10-CM | POA: Diagnosis not present

## 2019-09-06 DIAGNOSIS — Z125 Encounter for screening for malignant neoplasm of prostate: Secondary | ICD-10-CM | POA: Diagnosis not present
